# Patient Record
Sex: Male | Born: 1940 | Race: White | Hispanic: No | Marital: Married | Smoking: Former smoker
Health system: Southern US, Community
[De-identification: ages and names within clinical notes are randomized; demographics above are authoritative.]

## PROBLEM LIST (undated history)

## (undated) DIAGNOSIS — I482 Chronic atrial fibrillation, unspecified: Secondary | ICD-10-CM

## (undated) DIAGNOSIS — C61 Malignant neoplasm of prostate: Secondary | ICD-10-CM

## (undated) DIAGNOSIS — I2510 Atherosclerotic heart disease of native coronary artery without angina pectoris: Secondary | ICD-10-CM

## (undated) DIAGNOSIS — I350 Nonrheumatic aortic (valve) stenosis: Secondary | ICD-10-CM

## (undated) DIAGNOSIS — M1990 Unspecified osteoarthritis, unspecified site: Secondary | ICD-10-CM

## (undated) DIAGNOSIS — E785 Hyperlipidemia, unspecified: Secondary | ICD-10-CM

## (undated) DIAGNOSIS — Z952 Presence of prosthetic heart valve: Secondary | ICD-10-CM

## (undated) HISTORY — DX: Hyperlipidemia, unspecified: E78.5

## (undated) HISTORY — DX: Unspecified osteoarthritis, unspecified site: M19.90

## (undated) HISTORY — PX: JOINT REPLACEMENT: SHX530

## (undated) HISTORY — PX: APPENDECTOMY: SHX54

## (undated) HISTORY — DX: Malignant neoplasm of prostate: C61

## (undated) HISTORY — PX: INSERTION PROSTATE RADIATION SEED: SUR718

## (undated) HISTORY — DX: Atherosclerotic heart disease of native coronary artery without angina pectoris: I25.10

## (undated) HISTORY — PX: REPLACEMENT TOTAL KNEE BILATERAL: SUR1225

## (undated) HISTORY — PX: CORONARY ANGIOPLASTY WITH STENT PLACEMENT: SHX49

---

## 2003-11-28 ENCOUNTER — Ambulatory Visit: Payer: Self-pay | Admitting: Family Medicine

## 2003-12-13 ENCOUNTER — Encounter: Admission: RE | Admit: 2003-12-13 | Discharge: 2003-12-13 | Payer: Self-pay | Admitting: Family Medicine

## 2004-08-20 ENCOUNTER — Ambulatory Visit: Payer: Self-pay | Admitting: Family Medicine

## 2005-01-10 ENCOUNTER — Ambulatory Visit: Payer: Self-pay | Admitting: Family Medicine

## 2006-09-04 ENCOUNTER — Ambulatory Visit: Admission: RE | Admit: 2006-09-04 | Discharge: 2006-12-03 | Payer: Self-pay | Admitting: Family Medicine

## 2006-11-27 ENCOUNTER — Encounter: Admission: RE | Admit: 2006-11-27 | Discharge: 2006-11-27 | Payer: Self-pay | Admitting: Urology

## 2006-12-07 ENCOUNTER — Ambulatory Visit (HOSPITAL_BASED_OUTPATIENT_CLINIC_OR_DEPARTMENT_OTHER): Admission: RE | Admit: 2006-12-07 | Discharge: 2006-12-07 | Payer: Self-pay | Admitting: Urology

## 2007-01-01 ENCOUNTER — Ambulatory Visit: Admission: RE | Admit: 2007-01-01 | Discharge: 2007-02-09 | Payer: Self-pay | Admitting: Radiation Oncology

## 2010-06-25 NOTE — Op Note (Signed)
NAME:  Eric Perry, Eric Perry              ACCOUNT NO.:  [id number]   MEDICAL RECORD NO.:  [id number]          PATIENT TYPE:  AMB   LOCATION:  [facility]                         FACILITY:  [facility]   PHYSICIAN:  Mark C. Vernie Ammons, M.D.  DATE OF BIRTH:  Jun 15, 1940   DATE OF PROCEDURE:  12/07/2006  DATE OF DISCHARGE:                               OPERATIVE REPORT   PREOPERATIVE DIAGNOSIS:  Adenocarcinoma of the prostate.   POSTOPERATIVE DIAGNOSIS:  1. Adenocarcinoma of the prostate.  2. Bladder diverticulum.   SURGEON:  Mark C. Vernie Ammons, M.D.   RADIATION ONCOLOGIST:  Artist Pais. Kathrynn Running, M.D.   ANESTHESIA:  General.   DRAIN:  16-French Foley catheter.   BLOOD LOSS:  Minimal.   SPECIMENS:  None.   NUMBER OF NEEDLES:  19.   NUMBER OF SEEDS:  69.   COMPLICATIONS:  None.   INDICATIONS:  The patient is a 70 year old white male with biopsy-proven  adenocarcinoma of the prostate Gleason 4+3.  He elected to undergo  treatment of his prostate cancer with radiation and has undergone  external beam boost.  He presents today for completion of his radiation  treatment with radioactive seed implant.  The risks, complications and  alternatives were discussed.  He understands and elected to proceed.   DESCRIPTION OF OPERATION:  After informed consent, the patient brought  to major OR, placed on table administered general anesthesia then moved  to the dorsal lithotomy position.  An official time-out was then  performed.  His genitalia and perineum were sterilely prepped and  draped.  A catheter was placed in the bladder with dilute contrast.  The  rectal tube and an transrectal ultrasound probe was placed and affixed  to the stand.  Dr. Kathrynn Running then performed real time seed planning.   I then proceeded to use the Nucletron and the real time plan to insert  the 19 needles under direct ultrasound control and visualization.  This  proceeded without complication.  The transrectal ultrasound probe and  rectal  tube were removed and as well as the Foley catheter.  The penis  was reprepped with Betadine and flexible cystoscopy was then performed.   Flexible cystoscopy was performed using a 17-French flexible scope.  Urethra is noted to be normal down to the sphincter which appeared  intact.  Prostatic urethra revealed bilobar hypertrophy and semi-  elongation.  On the floor the prostate, I saw a single seed protruding  from the prostatic mucosa.  I used the scope to gently tease the seed  out and this passed on into the bladder.  I then entered the bladder and  fully inspected this.  It was noted to be free of any tumor, stones or  inflammatory lesions.  On the floor of the bladder on the right-hand  side was a large wide-mouth diverticulum.  I scoped this and found no  tumor, stones and no seed.  There was some trabeculation of the bladder  making it difficult to identify the seed that had to passed into the  bladder.  Since the risk was of the single seen in the bladder was  low,  I felt further search was not necessary.  Therefore I replaced the 16-  French Foley catheter.  This was connected to closed system drainage.  The patient tolerated procedure well with no intraoperative  complications.   He will be given a prescription for 24 Vicodin 10 and Cipro 500 mg  tablets and seven Flomax 0.4 mg and will return to my office in three  weeks for follow-up.      Mark C. Vernie Ammons, M.D.  Electronically Signed     MCO/MEDQ  D:  12/07/2006  T:  12/07/2006  Job:  [id number]   cc:   Artist Pais Kathrynn Running, M.D.  Fax: [phone]

## 2010-11-20 LAB — CBC
HCT: 43.8
Hemoglobin: 15.2
MCHC: 34.6
MCV: 87.4
Platelets: 196
RBC: 5.02
RDW: 13.6
WBC: 6.3

## 2010-11-20 LAB — COMPREHENSIVE METABOLIC PANEL
ALT: 17
AST: 20
Albumin: 3.5
Alkaline Phosphatase: 62
BUN: 7
CO2: 32
Calcium: 9.2
Chloride: 96
Creatinine, Ser: 0.64
GFR calc Af Amer: >60
GFR calc non Af Amer: >60
Glucose, Bld: 130 — ABNORMAL HIGH
Potassium: 3.8
Sodium: 135
Total Bilirubin: 1.1
Total Protein: 6.1

## 2010-11-20 LAB — APTT: aPTT: 30

## 2010-11-20 LAB — PROTIME-INR
INR: 1
Prothrombin Time: 13.2

## 2011-03-14 DIAGNOSIS — M5030 Other cervical disc degeneration, unspecified cervical region: Secondary | ICD-10-CM | POA: Diagnosis not present

## 2011-03-14 DIAGNOSIS — M999 Biomechanical lesion, unspecified: Secondary | ICD-10-CM | POA: Diagnosis not present

## 2011-03-14 DIAGNOSIS — M9981 Other biomechanical lesions of cervical region: Secondary | ICD-10-CM | POA: Diagnosis not present

## 2011-03-14 DIAGNOSIS — M5412 Radiculopathy, cervical region: Secondary | ICD-10-CM | POA: Diagnosis not present

## 2011-04-11 DIAGNOSIS — M5412 Radiculopathy, cervical region: Secondary | ICD-10-CM | POA: Diagnosis not present

## 2011-04-11 DIAGNOSIS — M9981 Other biomechanical lesions of cervical region: Secondary | ICD-10-CM | POA: Diagnosis not present

## 2011-04-11 DIAGNOSIS — M999 Biomechanical lesion, unspecified: Secondary | ICD-10-CM | POA: Diagnosis not present

## 2011-04-11 DIAGNOSIS — M5030 Other cervical disc degeneration, unspecified cervical region: Secondary | ICD-10-CM | POA: Diagnosis not present

## 2011-06-12 DIAGNOSIS — M9981 Other biomechanical lesions of cervical region: Secondary | ICD-10-CM | POA: Diagnosis not present

## 2011-06-12 DIAGNOSIS — M5030 Other cervical disc degeneration, unspecified cervical region: Secondary | ICD-10-CM | POA: Diagnosis not present

## 2011-06-12 DIAGNOSIS — M999 Biomechanical lesion, unspecified: Secondary | ICD-10-CM | POA: Diagnosis not present

## 2011-06-12 DIAGNOSIS — M5412 Radiculopathy, cervical region: Secondary | ICD-10-CM | POA: Diagnosis not present

## 2011-06-13 DIAGNOSIS — M1710 Unilateral primary osteoarthritis, unspecified knee: Secondary | ICD-10-CM | POA: Diagnosis not present

## 2011-06-13 DIAGNOSIS — M25569 Pain in unspecified knee: Secondary | ICD-10-CM | POA: Diagnosis not present

## 2011-06-13 DIAGNOSIS — Z6835 Body mass index (BMI) 35.0-35.9, adult: Secondary | ICD-10-CM | POA: Diagnosis not present

## 2011-06-19 DIAGNOSIS — M5412 Radiculopathy, cervical region: Secondary | ICD-10-CM | POA: Diagnosis not present

## 2011-06-19 DIAGNOSIS — M999 Biomechanical lesion, unspecified: Secondary | ICD-10-CM | POA: Diagnosis not present

## 2011-06-19 DIAGNOSIS — M5030 Other cervical disc degeneration, unspecified cervical region: Secondary | ICD-10-CM | POA: Diagnosis not present

## 2011-06-19 DIAGNOSIS — M9981 Other biomechanical lesions of cervical region: Secondary | ICD-10-CM | POA: Diagnosis not present

## 2011-07-21 DIAGNOSIS — M1710 Unilateral primary osteoarthritis, unspecified knee: Secondary | ICD-10-CM | POA: Diagnosis not present

## 2011-07-23 DIAGNOSIS — Z8546 Personal history of malignant neoplasm of prostate: Secondary | ICD-10-CM | POA: Diagnosis not present

## 2011-07-29 DIAGNOSIS — R339 Retention of urine, unspecified: Secondary | ICD-10-CM | POA: Diagnosis not present

## 2011-07-29 DIAGNOSIS — N401 Enlarged prostate with lower urinary tract symptoms: Secondary | ICD-10-CM | POA: Diagnosis not present

## 2011-07-29 DIAGNOSIS — Z8546 Personal history of malignant neoplasm of prostate: Secondary | ICD-10-CM | POA: Diagnosis not present

## 2011-07-29 DIAGNOSIS — N3000 Acute cystitis without hematuria: Secondary | ICD-10-CM | POA: Diagnosis not present

## 2011-07-31 DIAGNOSIS — M9981 Other biomechanical lesions of cervical region: Secondary | ICD-10-CM | POA: Diagnosis not present

## 2011-07-31 DIAGNOSIS — M5030 Other cervical disc degeneration, unspecified cervical region: Secondary | ICD-10-CM | POA: Diagnosis not present

## 2011-07-31 DIAGNOSIS — M5412 Radiculopathy, cervical region: Secondary | ICD-10-CM | POA: Diagnosis not present

## 2011-07-31 DIAGNOSIS — M999 Biomechanical lesion, unspecified: Secondary | ICD-10-CM | POA: Diagnosis not present

## 2011-08-18 DIAGNOSIS — M1710 Unilateral primary osteoarthritis, unspecified knee: Secondary | ICD-10-CM | POA: Diagnosis not present

## 2011-08-22 DIAGNOSIS — M999 Biomechanical lesion, unspecified: Secondary | ICD-10-CM | POA: Diagnosis not present

## 2011-08-22 DIAGNOSIS — M9981 Other biomechanical lesions of cervical region: Secondary | ICD-10-CM | POA: Diagnosis not present

## 2011-08-22 DIAGNOSIS — M5412 Radiculopathy, cervical region: Secondary | ICD-10-CM | POA: Diagnosis not present

## 2011-08-22 DIAGNOSIS — M5030 Other cervical disc degeneration, unspecified cervical region: Secondary | ICD-10-CM | POA: Diagnosis not present

## 2011-08-25 DIAGNOSIS — M1710 Unilateral primary osteoarthritis, unspecified knee: Secondary | ICD-10-CM | POA: Diagnosis not present

## 2011-08-26 DIAGNOSIS — IMO0002 Reserved for concepts with insufficient information to code with codable children: Secondary | ICD-10-CM | POA: Diagnosis not present

## 2011-08-26 DIAGNOSIS — M549 Dorsalgia, unspecified: Secondary | ICD-10-CM | POA: Diagnosis not present

## 2011-09-01 DIAGNOSIS — M1710 Unilateral primary osteoarthritis, unspecified knee: Secondary | ICD-10-CM | POA: Diagnosis not present

## 2011-10-09 DIAGNOSIS — M5412 Radiculopathy, cervical region: Secondary | ICD-10-CM | POA: Diagnosis not present

## 2011-10-09 DIAGNOSIS — M5030 Other cervical disc degeneration, unspecified cervical region: Secondary | ICD-10-CM | POA: Diagnosis not present

## 2011-10-09 DIAGNOSIS — M9981 Other biomechanical lesions of cervical region: Secondary | ICD-10-CM | POA: Diagnosis not present

## 2011-10-09 DIAGNOSIS — M999 Biomechanical lesion, unspecified: Secondary | ICD-10-CM | POA: Diagnosis not present

## 2011-10-15 DIAGNOSIS — M25569 Pain in unspecified knee: Secondary | ICD-10-CM | POA: Diagnosis not present

## 2011-10-15 DIAGNOSIS — M1710 Unilateral primary osteoarthritis, unspecified knee: Secondary | ICD-10-CM | POA: Diagnosis not present

## 2011-11-27 DIAGNOSIS — M9981 Other biomechanical lesions of cervical region: Secondary | ICD-10-CM | POA: Diagnosis not present

## 2011-11-27 DIAGNOSIS — M5030 Other cervical disc degeneration, unspecified cervical region: Secondary | ICD-10-CM | POA: Diagnosis not present

## 2011-11-27 DIAGNOSIS — M5412 Radiculopathy, cervical region: Secondary | ICD-10-CM | POA: Diagnosis not present

## 2011-11-27 DIAGNOSIS — M999 Biomechanical lesion, unspecified: Secondary | ICD-10-CM | POA: Diagnosis not present

## 2012-01-01 DIAGNOSIS — C44621 Squamous cell carcinoma of skin of unspecified upper limb, including shoulder: Secondary | ICD-10-CM | POA: Diagnosis not present

## 2012-01-01 DIAGNOSIS — D485 Neoplasm of uncertain behavior of skin: Secondary | ICD-10-CM | POA: Diagnosis not present

## 2012-01-14 DIAGNOSIS — C44691 Other specified malignant neoplasm of skin of unspecified upper limb, including shoulder: Secondary | ICD-10-CM | POA: Diagnosis not present

## 2012-01-14 DIAGNOSIS — L988 Other specified disorders of the skin and subcutaneous tissue: Secondary | ICD-10-CM | POA: Diagnosis not present

## 2012-01-14 DIAGNOSIS — C44621 Squamous cell carcinoma of skin of unspecified upper limb, including shoulder: Secondary | ICD-10-CM | POA: Diagnosis not present

## 2012-01-14 DIAGNOSIS — L089 Local infection of the skin and subcutaneous tissue, unspecified: Secondary | ICD-10-CM | POA: Diagnosis not present

## 2012-01-30 DIAGNOSIS — M9981 Other biomechanical lesions of cervical region: Secondary | ICD-10-CM | POA: Diagnosis not present

## 2012-01-30 DIAGNOSIS — M5412 Radiculopathy, cervical region: Secondary | ICD-10-CM | POA: Diagnosis not present

## 2012-01-30 DIAGNOSIS — M5030 Other cervical disc degeneration, unspecified cervical region: Secondary | ICD-10-CM | POA: Diagnosis not present

## 2012-01-30 DIAGNOSIS — M999 Biomechanical lesion, unspecified: Secondary | ICD-10-CM | POA: Diagnosis not present

## 2012-03-12 DIAGNOSIS — M9981 Other biomechanical lesions of cervical region: Secondary | ICD-10-CM | POA: Diagnosis not present

## 2012-03-12 DIAGNOSIS — M5412 Radiculopathy, cervical region: Secondary | ICD-10-CM | POA: Diagnosis not present

## 2012-03-12 DIAGNOSIS — M999 Biomechanical lesion, unspecified: Secondary | ICD-10-CM | POA: Diagnosis not present

## 2012-03-12 DIAGNOSIS — M5030 Other cervical disc degeneration, unspecified cervical region: Secondary | ICD-10-CM | POA: Diagnosis not present

## 2012-04-23 DIAGNOSIS — M5412 Radiculopathy, cervical region: Secondary | ICD-10-CM | POA: Diagnosis not present

## 2012-04-23 DIAGNOSIS — M9981 Other biomechanical lesions of cervical region: Secondary | ICD-10-CM | POA: Diagnosis not present

## 2012-04-23 DIAGNOSIS — M999 Biomechanical lesion, unspecified: Secondary | ICD-10-CM | POA: Diagnosis not present

## 2012-04-23 DIAGNOSIS — M5030 Other cervical disc degeneration, unspecified cervical region: Secondary | ICD-10-CM | POA: Diagnosis not present

## 2012-06-03 DIAGNOSIS — M9981 Other biomechanical lesions of cervical region: Secondary | ICD-10-CM | POA: Diagnosis not present

## 2012-06-03 DIAGNOSIS — M5030 Other cervical disc degeneration, unspecified cervical region: Secondary | ICD-10-CM | POA: Diagnosis not present

## 2012-06-03 DIAGNOSIS — M5412 Radiculopathy, cervical region: Secondary | ICD-10-CM | POA: Diagnosis not present

## 2012-06-03 DIAGNOSIS — M999 Biomechanical lesion, unspecified: Secondary | ICD-10-CM | POA: Diagnosis not present

## 2012-07-01 DIAGNOSIS — M5030 Other cervical disc degeneration, unspecified cervical region: Secondary | ICD-10-CM | POA: Diagnosis not present

## 2012-07-01 DIAGNOSIS — M5412 Radiculopathy, cervical region: Secondary | ICD-10-CM | POA: Diagnosis not present

## 2012-07-01 DIAGNOSIS — M999 Biomechanical lesion, unspecified: Secondary | ICD-10-CM | POA: Diagnosis not present

## 2012-07-01 DIAGNOSIS — M9981 Other biomechanical lesions of cervical region: Secondary | ICD-10-CM | POA: Diagnosis not present

## 2012-08-02 DIAGNOSIS — N401 Enlarged prostate with lower urinary tract symptoms: Secondary | ICD-10-CM | POA: Diagnosis not present

## 2012-08-02 DIAGNOSIS — Z8546 Personal history of malignant neoplasm of prostate: Secondary | ICD-10-CM | POA: Diagnosis not present

## 2012-08-02 DIAGNOSIS — R351 Nocturia: Secondary | ICD-10-CM | POA: Diagnosis not present

## 2012-08-02 DIAGNOSIS — R339 Retention of urine, unspecified: Secondary | ICD-10-CM | POA: Diagnosis not present

## 2012-10-08 DIAGNOSIS — M999 Biomechanical lesion, unspecified: Secondary | ICD-10-CM | POA: Diagnosis not present

## 2012-10-08 DIAGNOSIS — M5412 Radiculopathy, cervical region: Secondary | ICD-10-CM | POA: Diagnosis not present

## 2012-10-08 DIAGNOSIS — M9981 Other biomechanical lesions of cervical region: Secondary | ICD-10-CM | POA: Diagnosis not present

## 2012-10-08 DIAGNOSIS — M5030 Other cervical disc degeneration, unspecified cervical region: Secondary | ICD-10-CM | POA: Diagnosis not present

## 2012-11-24 DIAGNOSIS — Z23 Encounter for immunization: Secondary | ICD-10-CM | POA: Diagnosis not present

## 2012-12-24 DIAGNOSIS — M5030 Other cervical disc degeneration, unspecified cervical region: Secondary | ICD-10-CM | POA: Diagnosis not present

## 2012-12-24 DIAGNOSIS — M5412 Radiculopathy, cervical region: Secondary | ICD-10-CM | POA: Diagnosis not present

## 2012-12-24 DIAGNOSIS — M999 Biomechanical lesion, unspecified: Secondary | ICD-10-CM | POA: Diagnosis not present

## 2012-12-24 DIAGNOSIS — M9981 Other biomechanical lesions of cervical region: Secondary | ICD-10-CM | POA: Diagnosis not present

## 2013-01-28 DIAGNOSIS — M5030 Other cervical disc degeneration, unspecified cervical region: Secondary | ICD-10-CM | POA: Diagnosis not present

## 2013-01-28 DIAGNOSIS — M5412 Radiculopathy, cervical region: Secondary | ICD-10-CM | POA: Diagnosis not present

## 2013-01-28 DIAGNOSIS — M999 Biomechanical lesion, unspecified: Secondary | ICD-10-CM | POA: Diagnosis not present

## 2013-01-28 DIAGNOSIS — M9981 Other biomechanical lesions of cervical region: Secondary | ICD-10-CM | POA: Diagnosis not present

## 2013-02-18 DIAGNOSIS — M1710 Unilateral primary osteoarthritis, unspecified knee: Secondary | ICD-10-CM | POA: Diagnosis not present

## 2013-02-18 DIAGNOSIS — M25569 Pain in unspecified knee: Secondary | ICD-10-CM | POA: Diagnosis not present

## 2013-03-24 DIAGNOSIS — Z01810 Encounter for preprocedural cardiovascular examination: Secondary | ICD-10-CM | POA: Diagnosis not present

## 2013-03-24 DIAGNOSIS — K044 Acute apical periodontitis of pulpal origin: Secondary | ICD-10-CM | POA: Diagnosis not present

## 2013-04-12 DIAGNOSIS — M9981 Other biomechanical lesions of cervical region: Secondary | ICD-10-CM | POA: Diagnosis not present

## 2013-04-12 DIAGNOSIS — M5412 Radiculopathy, cervical region: Secondary | ICD-10-CM | POA: Diagnosis not present

## 2013-04-12 DIAGNOSIS — M1710 Unilateral primary osteoarthritis, unspecified knee: Secondary | ICD-10-CM | POA: Diagnosis not present

## 2013-04-12 DIAGNOSIS — M999 Biomechanical lesion, unspecified: Secondary | ICD-10-CM | POA: Diagnosis not present

## 2013-04-12 DIAGNOSIS — M5030 Other cervical disc degeneration, unspecified cervical region: Secondary | ICD-10-CM | POA: Diagnosis not present

## 2013-05-17 DIAGNOSIS — M1990 Unspecified osteoarthritis, unspecified site: Secondary | ICD-10-CM | POA: Diagnosis not present

## 2013-05-17 DIAGNOSIS — M25569 Pain in unspecified knee: Secondary | ICD-10-CM | POA: Diagnosis not present

## 2013-05-25 DIAGNOSIS — M1990 Unspecified osteoarthritis, unspecified site: Secondary | ICD-10-CM | POA: Diagnosis not present

## 2013-05-31 DIAGNOSIS — I10 Essential (primary) hypertension: Secondary | ICD-10-CM | POA: Diagnosis not present

## 2013-05-31 DIAGNOSIS — I2510 Atherosclerotic heart disease of native coronary artery without angina pectoris: Secondary | ICD-10-CM | POA: Diagnosis not present

## 2013-06-01 DIAGNOSIS — Z01818 Encounter for other preprocedural examination: Secondary | ICD-10-CM | POA: Diagnosis not present

## 2013-06-01 DIAGNOSIS — R52 Pain, unspecified: Secondary | ICD-10-CM | POA: Diagnosis not present

## 2013-06-01 DIAGNOSIS — Z79899 Other long term (current) drug therapy: Secondary | ICD-10-CM | POA: Diagnosis not present

## 2013-06-01 DIAGNOSIS — Z96659 Presence of unspecified artificial knee joint: Secondary | ICD-10-CM | POA: Diagnosis not present

## 2013-06-01 DIAGNOSIS — M79609 Pain in unspecified limb: Secondary | ICD-10-CM | POA: Diagnosis not present

## 2013-06-01 DIAGNOSIS — Z0389 Encounter for observation for other suspected diseases and conditions ruled out: Secondary | ICD-10-CM | POA: Diagnosis not present

## 2013-06-09 DIAGNOSIS — E7800 Pure hypercholesterolemia, unspecified: Secondary | ICD-10-CM | POA: Diagnosis present

## 2013-06-09 DIAGNOSIS — Z96659 Presence of unspecified artificial knee joint: Secondary | ICD-10-CM | POA: Diagnosis not present

## 2013-06-09 DIAGNOSIS — Z79899 Other long term (current) drug therapy: Secondary | ICD-10-CM | POA: Diagnosis not present

## 2013-06-09 DIAGNOSIS — I2510 Atherosclerotic heart disease of native coronary artery without angina pectoris: Secondary | ICD-10-CM | POA: Diagnosis not present

## 2013-06-09 DIAGNOSIS — N400 Enlarged prostate without lower urinary tract symptoms: Secondary | ICD-10-CM | POA: Diagnosis present

## 2013-06-09 DIAGNOSIS — K219 Gastro-esophageal reflux disease without esophagitis: Secondary | ICD-10-CM | POA: Diagnosis present

## 2013-06-09 DIAGNOSIS — I519 Heart disease, unspecified: Secondary | ICD-10-CM | POA: Diagnosis present

## 2013-06-09 DIAGNOSIS — G8929 Other chronic pain: Secondary | ICD-10-CM | POA: Diagnosis present

## 2013-06-09 DIAGNOSIS — IMO0002 Reserved for concepts with insufficient information to code with codable children: Secondary | ICD-10-CM | POA: Diagnosis not present

## 2013-06-09 DIAGNOSIS — Z471 Aftercare following joint replacement surgery: Secondary | ICD-10-CM | POA: Diagnosis not present

## 2013-06-09 DIAGNOSIS — Z9861 Coronary angioplasty status: Secondary | ICD-10-CM | POA: Diagnosis not present

## 2013-06-09 DIAGNOSIS — Z7982 Long term (current) use of aspirin: Secondary | ICD-10-CM | POA: Diagnosis not present

## 2013-06-09 DIAGNOSIS — I10 Essential (primary) hypertension: Secondary | ICD-10-CM | POA: Diagnosis not present

## 2013-06-09 DIAGNOSIS — M1710 Unilateral primary osteoarthritis, unspecified knee: Secondary | ICD-10-CM | POA: Diagnosis present

## 2013-06-12 DIAGNOSIS — R262 Difficulty in walking, not elsewhere classified: Secondary | ICD-10-CM | POA: Diagnosis not present

## 2013-06-12 DIAGNOSIS — IMO0001 Reserved for inherently not codable concepts without codable children: Secondary | ICD-10-CM | POA: Diagnosis not present

## 2013-06-12 DIAGNOSIS — Z96659 Presence of unspecified artificial knee joint: Secondary | ICD-10-CM | POA: Diagnosis not present

## 2013-06-12 DIAGNOSIS — M6281 Muscle weakness (generalized): Secondary | ICD-10-CM | POA: Diagnosis not present

## 2013-06-12 DIAGNOSIS — M25569 Pain in unspecified knee: Secondary | ICD-10-CM | POA: Diagnosis not present

## 2013-06-12 DIAGNOSIS — Z471 Aftercare following joint replacement surgery: Secondary | ICD-10-CM | POA: Diagnosis not present

## 2013-06-13 DIAGNOSIS — Z96659 Presence of unspecified artificial knee joint: Secondary | ICD-10-CM | POA: Diagnosis not present

## 2013-06-13 DIAGNOSIS — IMO0001 Reserved for inherently not codable concepts without codable children: Secondary | ICD-10-CM | POA: Diagnosis not present

## 2013-06-13 DIAGNOSIS — M25569 Pain in unspecified knee: Secondary | ICD-10-CM | POA: Diagnosis not present

## 2013-06-13 DIAGNOSIS — M6281 Muscle weakness (generalized): Secondary | ICD-10-CM | POA: Diagnosis not present

## 2013-06-13 DIAGNOSIS — Z471 Aftercare following joint replacement surgery: Secondary | ICD-10-CM | POA: Diagnosis not present

## 2013-06-13 DIAGNOSIS — R262 Difficulty in walking, not elsewhere classified: Secondary | ICD-10-CM | POA: Diagnosis not present

## 2013-06-15 DIAGNOSIS — M6281 Muscle weakness (generalized): Secondary | ICD-10-CM | POA: Diagnosis not present

## 2013-06-15 DIAGNOSIS — Z471 Aftercare following joint replacement surgery: Secondary | ICD-10-CM | POA: Diagnosis not present

## 2013-06-15 DIAGNOSIS — IMO0001 Reserved for inherently not codable concepts without codable children: Secondary | ICD-10-CM | POA: Diagnosis not present

## 2013-06-15 DIAGNOSIS — Z96659 Presence of unspecified artificial knee joint: Secondary | ICD-10-CM | POA: Diagnosis not present

## 2013-06-15 DIAGNOSIS — R262 Difficulty in walking, not elsewhere classified: Secondary | ICD-10-CM | POA: Diagnosis not present

## 2013-06-15 DIAGNOSIS — M25569 Pain in unspecified knee: Secondary | ICD-10-CM | POA: Diagnosis not present

## 2013-06-17 DIAGNOSIS — R262 Difficulty in walking, not elsewhere classified: Secondary | ICD-10-CM | POA: Diagnosis not present

## 2013-06-17 DIAGNOSIS — M6281 Muscle weakness (generalized): Secondary | ICD-10-CM | POA: Diagnosis not present

## 2013-06-17 DIAGNOSIS — Z96659 Presence of unspecified artificial knee joint: Secondary | ICD-10-CM | POA: Diagnosis not present

## 2013-06-17 DIAGNOSIS — IMO0001 Reserved for inherently not codable concepts without codable children: Secondary | ICD-10-CM | POA: Diagnosis not present

## 2013-06-17 DIAGNOSIS — Z471 Aftercare following joint replacement surgery: Secondary | ICD-10-CM | POA: Diagnosis not present

## 2013-06-17 DIAGNOSIS — M25569 Pain in unspecified knee: Secondary | ICD-10-CM | POA: Diagnosis not present

## 2013-06-20 DIAGNOSIS — M25569 Pain in unspecified knee: Secondary | ICD-10-CM | POA: Diagnosis not present

## 2013-06-20 DIAGNOSIS — IMO0001 Reserved for inherently not codable concepts without codable children: Secondary | ICD-10-CM | POA: Diagnosis not present

## 2013-06-20 DIAGNOSIS — M6281 Muscle weakness (generalized): Secondary | ICD-10-CM | POA: Diagnosis not present

## 2013-06-20 DIAGNOSIS — R262 Difficulty in walking, not elsewhere classified: Secondary | ICD-10-CM | POA: Diagnosis not present

## 2013-06-20 DIAGNOSIS — Z471 Aftercare following joint replacement surgery: Secondary | ICD-10-CM | POA: Diagnosis not present

## 2013-06-20 DIAGNOSIS — Z96659 Presence of unspecified artificial knee joint: Secondary | ICD-10-CM | POA: Diagnosis not present

## 2013-06-22 DIAGNOSIS — M1710 Unilateral primary osteoarthritis, unspecified knee: Secondary | ICD-10-CM | POA: Diagnosis not present

## 2013-06-22 DIAGNOSIS — IMO0002 Reserved for concepts with insufficient information to code with codable children: Secondary | ICD-10-CM | POA: Diagnosis not present

## 2013-06-24 DIAGNOSIS — M1710 Unilateral primary osteoarthritis, unspecified knee: Secondary | ICD-10-CM | POA: Diagnosis not present

## 2013-06-24 DIAGNOSIS — IMO0002 Reserved for concepts with insufficient information to code with codable children: Secondary | ICD-10-CM | POA: Diagnosis not present

## 2013-06-27 DIAGNOSIS — M1710 Unilateral primary osteoarthritis, unspecified knee: Secondary | ICD-10-CM | POA: Diagnosis not present

## 2013-06-27 DIAGNOSIS — IMO0002 Reserved for concepts with insufficient information to code with codable children: Secondary | ICD-10-CM | POA: Diagnosis not present

## 2013-06-29 DIAGNOSIS — M1710 Unilateral primary osteoarthritis, unspecified knee: Secondary | ICD-10-CM | POA: Diagnosis not present

## 2013-06-29 DIAGNOSIS — IMO0002 Reserved for concepts with insufficient information to code with codable children: Secondary | ICD-10-CM | POA: Diagnosis not present

## 2013-07-01 DIAGNOSIS — M1710 Unilateral primary osteoarthritis, unspecified knee: Secondary | ICD-10-CM | POA: Diagnosis not present

## 2013-07-01 DIAGNOSIS — IMO0002 Reserved for concepts with insufficient information to code with codable children: Secondary | ICD-10-CM | POA: Diagnosis not present

## 2013-07-05 DIAGNOSIS — IMO0002 Reserved for concepts with insufficient information to code with codable children: Secondary | ICD-10-CM | POA: Diagnosis not present

## 2013-07-05 DIAGNOSIS — M1710 Unilateral primary osteoarthritis, unspecified knee: Secondary | ICD-10-CM | POA: Diagnosis not present

## 2013-07-07 DIAGNOSIS — M1710 Unilateral primary osteoarthritis, unspecified knee: Secondary | ICD-10-CM | POA: Diagnosis not present

## 2013-07-07 DIAGNOSIS — IMO0002 Reserved for concepts with insufficient information to code with codable children: Secondary | ICD-10-CM | POA: Diagnosis not present

## 2013-07-12 DIAGNOSIS — M1710 Unilateral primary osteoarthritis, unspecified knee: Secondary | ICD-10-CM | POA: Diagnosis not present

## 2013-07-12 DIAGNOSIS — M5030 Other cervical disc degeneration, unspecified cervical region: Secondary | ICD-10-CM | POA: Diagnosis not present

## 2013-07-12 DIAGNOSIS — M5412 Radiculopathy, cervical region: Secondary | ICD-10-CM | POA: Diagnosis not present

## 2013-07-12 DIAGNOSIS — M999 Biomechanical lesion, unspecified: Secondary | ICD-10-CM | POA: Diagnosis not present

## 2013-07-12 DIAGNOSIS — M9981 Other biomechanical lesions of cervical region: Secondary | ICD-10-CM | POA: Diagnosis not present

## 2013-07-12 DIAGNOSIS — IMO0002 Reserved for concepts with insufficient information to code with codable children: Secondary | ICD-10-CM | POA: Diagnosis not present

## 2013-07-14 DIAGNOSIS — M5412 Radiculopathy, cervical region: Secondary | ICD-10-CM | POA: Diagnosis not present

## 2013-07-14 DIAGNOSIS — M1710 Unilateral primary osteoarthritis, unspecified knee: Secondary | ICD-10-CM | POA: Diagnosis not present

## 2013-07-14 DIAGNOSIS — M5030 Other cervical disc degeneration, unspecified cervical region: Secondary | ICD-10-CM | POA: Diagnosis not present

## 2013-07-14 DIAGNOSIS — M999 Biomechanical lesion, unspecified: Secondary | ICD-10-CM | POA: Diagnosis not present

## 2013-07-14 DIAGNOSIS — M9981 Other biomechanical lesions of cervical region: Secondary | ICD-10-CM | POA: Diagnosis not present

## 2013-07-14 DIAGNOSIS — IMO0002 Reserved for concepts with insufficient information to code with codable children: Secondary | ICD-10-CM | POA: Diagnosis not present

## 2013-07-20 DIAGNOSIS — M1990 Unspecified osteoarthritis, unspecified site: Secondary | ICD-10-CM | POA: Diagnosis not present

## 2013-07-20 DIAGNOSIS — Z96659 Presence of unspecified artificial knee joint: Secondary | ICD-10-CM | POA: Diagnosis not present

## 2013-08-05 DIAGNOSIS — M999 Biomechanical lesion, unspecified: Secondary | ICD-10-CM | POA: Diagnosis not present

## 2013-08-05 DIAGNOSIS — M5030 Other cervical disc degeneration, unspecified cervical region: Secondary | ICD-10-CM | POA: Diagnosis not present

## 2013-08-05 DIAGNOSIS — M5412 Radiculopathy, cervical region: Secondary | ICD-10-CM | POA: Diagnosis not present

## 2013-08-05 DIAGNOSIS — M9981 Other biomechanical lesions of cervical region: Secondary | ICD-10-CM | POA: Diagnosis not present

## 2013-08-09 DIAGNOSIS — N401 Enlarged prostate with lower urinary tract symptoms: Secondary | ICD-10-CM | POA: Diagnosis not present

## 2013-08-09 DIAGNOSIS — N138 Other obstructive and reflux uropathy: Secondary | ICD-10-CM | POA: Diagnosis not present

## 2013-08-09 DIAGNOSIS — N3944 Nocturnal enuresis: Secondary | ICD-10-CM | POA: Diagnosis not present

## 2013-08-09 DIAGNOSIS — Z8546 Personal history of malignant neoplasm of prostate: Secondary | ICD-10-CM | POA: Diagnosis not present

## 2013-10-06 DIAGNOSIS — M9981 Other biomechanical lesions of cervical region: Secondary | ICD-10-CM | POA: Diagnosis not present

## 2013-10-06 DIAGNOSIS — M5030 Other cervical disc degeneration, unspecified cervical region: Secondary | ICD-10-CM | POA: Diagnosis not present

## 2013-10-06 DIAGNOSIS — M5412 Radiculopathy, cervical region: Secondary | ICD-10-CM | POA: Diagnosis not present

## 2013-10-06 DIAGNOSIS — M999 Biomechanical lesion, unspecified: Secondary | ICD-10-CM | POA: Diagnosis not present

## 2013-11-03 DIAGNOSIS — M999 Biomechanical lesion, unspecified: Secondary | ICD-10-CM | POA: Diagnosis not present

## 2013-11-03 DIAGNOSIS — M9981 Other biomechanical lesions of cervical region: Secondary | ICD-10-CM | POA: Diagnosis not present

## 2013-11-03 DIAGNOSIS — Z23 Encounter for immunization: Secondary | ICD-10-CM | POA: Diagnosis not present

## 2013-11-03 DIAGNOSIS — M5412 Radiculopathy, cervical region: Secondary | ICD-10-CM | POA: Diagnosis not present

## 2013-11-03 DIAGNOSIS — Z0000 Encounter for general adult medical examination without abnormal findings: Secondary | ICD-10-CM | POA: Diagnosis not present

## 2013-11-03 DIAGNOSIS — M5030 Other cervical disc degeneration, unspecified cervical region: Secondary | ICD-10-CM | POA: Diagnosis not present

## 2013-11-03 DIAGNOSIS — K219 Gastro-esophageal reflux disease without esophagitis: Secondary | ICD-10-CM | POA: Diagnosis not present

## 2013-11-03 DIAGNOSIS — Z6833 Body mass index (BMI) 33.0-33.9, adult: Secondary | ICD-10-CM | POA: Diagnosis not present

## 2013-11-03 DIAGNOSIS — Z8546 Personal history of malignant neoplasm of prostate: Secondary | ICD-10-CM | POA: Diagnosis not present

## 2013-11-22 DIAGNOSIS — Z96651 Presence of right artificial knee joint: Secondary | ICD-10-CM | POA: Diagnosis not present

## 2013-12-03 DIAGNOSIS — R040 Epistaxis: Secondary | ICD-10-CM | POA: Diagnosis not present

## 2013-12-03 DIAGNOSIS — R220 Localized swelling, mass and lump, head: Secondary | ICD-10-CM | POA: Diagnosis not present

## 2013-12-03 DIAGNOSIS — W010XXA Fall on same level from slipping, tripping and stumbling without subsequent striking against object, initial encounter: Secondary | ICD-10-CM | POA: Diagnosis not present

## 2013-12-03 DIAGNOSIS — W1839XA Other fall on same level, initial encounter: Secondary | ICD-10-CM | POA: Diagnosis not present

## 2013-12-03 DIAGNOSIS — R079 Chest pain, unspecified: Secondary | ICD-10-CM | POA: Diagnosis not present

## 2013-12-03 DIAGNOSIS — Z7982 Long term (current) use of aspirin: Secondary | ICD-10-CM | POA: Diagnosis not present

## 2013-12-03 DIAGNOSIS — R51 Headache: Secondary | ICD-10-CM | POA: Diagnosis not present

## 2013-12-03 DIAGNOSIS — S299XXA Unspecified injury of thorax, initial encounter: Secondary | ICD-10-CM | POA: Diagnosis not present

## 2013-12-03 DIAGNOSIS — R0781 Pleurodynia: Secondary | ICD-10-CM | POA: Diagnosis not present

## 2013-12-03 DIAGNOSIS — M542 Cervicalgia: Secondary | ICD-10-CM | POA: Diagnosis not present

## 2013-12-04 DIAGNOSIS — W1839XA Other fall on same level, initial encounter: Secondary | ICD-10-CM | POA: Diagnosis not present

## 2013-12-04 DIAGNOSIS — R040 Epistaxis: Secondary | ICD-10-CM | POA: Diagnosis not present

## 2013-12-05 DIAGNOSIS — Z7982 Long term (current) use of aspirin: Secondary | ICD-10-CM | POA: Diagnosis not present

## 2013-12-05 DIAGNOSIS — E780 Pure hypercholesterolemia: Secondary | ICD-10-CM | POA: Diagnosis not present

## 2013-12-05 DIAGNOSIS — R040 Epistaxis: Secondary | ICD-10-CM | POA: Diagnosis not present

## 2013-12-05 DIAGNOSIS — Z79899 Other long term (current) drug therapy: Secondary | ICD-10-CM | POA: Diagnosis not present

## 2013-12-05 DIAGNOSIS — I2510 Atherosclerotic heart disease of native coronary artery without angina pectoris: Secondary | ICD-10-CM | POA: Diagnosis not present

## 2013-12-05 DIAGNOSIS — J342 Deviated nasal septum: Secondary | ICD-10-CM | POA: Diagnosis not present

## 2013-12-07 DIAGNOSIS — R040 Epistaxis: Secondary | ICD-10-CM | POA: Diagnosis not present

## 2013-12-07 DIAGNOSIS — J342 Deviated nasal septum: Secondary | ICD-10-CM | POA: Diagnosis not present

## 2013-12-07 DIAGNOSIS — Z7982 Long term (current) use of aspirin: Secondary | ICD-10-CM | POA: Diagnosis not present

## 2013-12-29 DIAGNOSIS — M5033 Other cervical disc degeneration, cervicothoracic region: Secondary | ICD-10-CM | POA: Diagnosis not present

## 2013-12-29 DIAGNOSIS — M9903 Segmental and somatic dysfunction of lumbar region: Secondary | ICD-10-CM | POA: Diagnosis not present

## 2013-12-29 DIAGNOSIS — M542 Cervicalgia: Secondary | ICD-10-CM | POA: Diagnosis not present

## 2013-12-29 DIAGNOSIS — M5137 Other intervertebral disc degeneration, lumbosacral region: Secondary | ICD-10-CM | POA: Diagnosis not present

## 2013-12-29 DIAGNOSIS — M9901 Segmental and somatic dysfunction of cervical region: Secondary | ICD-10-CM | POA: Diagnosis not present

## 2013-12-29 DIAGNOSIS — M9902 Segmental and somatic dysfunction of thoracic region: Secondary | ICD-10-CM | POA: Diagnosis not present

## 2013-12-29 DIAGNOSIS — M5442 Lumbago with sciatica, left side: Secondary | ICD-10-CM | POA: Diagnosis not present

## 2014-02-17 DIAGNOSIS — M5137 Other intervertebral disc degeneration, lumbosacral region: Secondary | ICD-10-CM | POA: Diagnosis not present

## 2014-02-17 DIAGNOSIS — M9903 Segmental and somatic dysfunction of lumbar region: Secondary | ICD-10-CM | POA: Diagnosis not present

## 2014-02-17 DIAGNOSIS — M5033 Other cervical disc degeneration, cervicothoracic region: Secondary | ICD-10-CM | POA: Diagnosis not present

## 2014-02-17 DIAGNOSIS — M542 Cervicalgia: Secondary | ICD-10-CM | POA: Diagnosis not present

## 2014-02-17 DIAGNOSIS — M9901 Segmental and somatic dysfunction of cervical region: Secondary | ICD-10-CM | POA: Diagnosis not present

## 2014-02-17 DIAGNOSIS — M9902 Segmental and somatic dysfunction of thoracic region: Secondary | ICD-10-CM | POA: Diagnosis not present

## 2014-02-17 DIAGNOSIS — M5442 Lumbago with sciatica, left side: Secondary | ICD-10-CM | POA: Diagnosis not present

## 2014-05-11 DIAGNOSIS — M542 Cervicalgia: Secondary | ICD-10-CM | POA: Diagnosis not present

## 2014-05-11 DIAGNOSIS — M5033 Other cervical disc degeneration, cervicothoracic region: Secondary | ICD-10-CM | POA: Diagnosis not present

## 2014-05-11 DIAGNOSIS — M9901 Segmental and somatic dysfunction of cervical region: Secondary | ICD-10-CM | POA: Diagnosis not present

## 2014-05-11 DIAGNOSIS — M5442 Lumbago with sciatica, left side: Secondary | ICD-10-CM | POA: Diagnosis not present

## 2014-05-11 DIAGNOSIS — M5137 Other intervertebral disc degeneration, lumbosacral region: Secondary | ICD-10-CM | POA: Diagnosis not present

## 2014-05-11 DIAGNOSIS — M9903 Segmental and somatic dysfunction of lumbar region: Secondary | ICD-10-CM | POA: Diagnosis not present

## 2014-05-11 DIAGNOSIS — M9902 Segmental and somatic dysfunction of thoracic region: Secondary | ICD-10-CM | POA: Diagnosis not present

## 2014-06-06 DIAGNOSIS — Z96651 Presence of right artificial knee joint: Secondary | ICD-10-CM | POA: Diagnosis not present

## 2014-08-11 DIAGNOSIS — M5033 Other cervical disc degeneration, cervicothoracic region: Secondary | ICD-10-CM | POA: Diagnosis not present

## 2014-08-11 DIAGNOSIS — M9902 Segmental and somatic dysfunction of thoracic region: Secondary | ICD-10-CM | POA: Diagnosis not present

## 2014-08-11 DIAGNOSIS — M542 Cervicalgia: Secondary | ICD-10-CM | POA: Diagnosis not present

## 2014-08-11 DIAGNOSIS — M5442 Lumbago with sciatica, left side: Secondary | ICD-10-CM | POA: Diagnosis not present

## 2014-08-11 DIAGNOSIS — M5137 Other intervertebral disc degeneration, lumbosacral region: Secondary | ICD-10-CM | POA: Diagnosis not present

## 2014-08-11 DIAGNOSIS — M9901 Segmental and somatic dysfunction of cervical region: Secondary | ICD-10-CM | POA: Diagnosis not present

## 2014-08-11 DIAGNOSIS — M9903 Segmental and somatic dysfunction of lumbar region: Secondary | ICD-10-CM | POA: Diagnosis not present

## 2014-08-24 DIAGNOSIS — N3944 Nocturnal enuresis: Secondary | ICD-10-CM | POA: Diagnosis not present

## 2014-08-24 DIAGNOSIS — Z8546 Personal history of malignant neoplasm of prostate: Secondary | ICD-10-CM | POA: Diagnosis not present

## 2014-08-24 DIAGNOSIS — N401 Enlarged prostate with lower urinary tract symptoms: Secondary | ICD-10-CM | POA: Diagnosis not present

## 2014-08-24 DIAGNOSIS — R351 Nocturia: Secondary | ICD-10-CM | POA: Diagnosis not present

## 2014-08-24 DIAGNOSIS — N138 Other obstructive and reflux uropathy: Secondary | ICD-10-CM | POA: Diagnosis not present

## 2014-10-26 DIAGNOSIS — M9901 Segmental and somatic dysfunction of cervical region: Secondary | ICD-10-CM | POA: Diagnosis not present

## 2014-10-26 DIAGNOSIS — M5033 Other cervical disc degeneration, cervicothoracic region: Secondary | ICD-10-CM | POA: Diagnosis not present

## 2014-10-26 DIAGNOSIS — M5442 Lumbago with sciatica, left side: Secondary | ICD-10-CM | POA: Diagnosis not present

## 2014-10-26 DIAGNOSIS — M9903 Segmental and somatic dysfunction of lumbar region: Secondary | ICD-10-CM | POA: Diagnosis not present

## 2014-10-26 DIAGNOSIS — M5137 Other intervertebral disc degeneration, lumbosacral region: Secondary | ICD-10-CM | POA: Diagnosis not present

## 2014-10-26 DIAGNOSIS — M9902 Segmental and somatic dysfunction of thoracic region: Secondary | ICD-10-CM | POA: Diagnosis not present

## 2014-10-26 DIAGNOSIS — M542 Cervicalgia: Secondary | ICD-10-CM | POA: Diagnosis not present

## 2014-11-21 DIAGNOSIS — Z23 Encounter for immunization: Secondary | ICD-10-CM | POA: Diagnosis not present

## 2014-12-19 DIAGNOSIS — M5033 Other cervical disc degeneration, cervicothoracic region: Secondary | ICD-10-CM | POA: Diagnosis not present

## 2014-12-19 DIAGNOSIS — M9902 Segmental and somatic dysfunction of thoracic region: Secondary | ICD-10-CM | POA: Diagnosis not present

## 2014-12-19 DIAGNOSIS — M5442 Lumbago with sciatica, left side: Secondary | ICD-10-CM | POA: Diagnosis not present

## 2014-12-19 DIAGNOSIS — M9903 Segmental and somatic dysfunction of lumbar region: Secondary | ICD-10-CM | POA: Diagnosis not present

## 2014-12-19 DIAGNOSIS — M542 Cervicalgia: Secondary | ICD-10-CM | POA: Diagnosis not present

## 2014-12-19 DIAGNOSIS — M9901 Segmental and somatic dysfunction of cervical region: Secondary | ICD-10-CM | POA: Diagnosis not present

## 2014-12-19 DIAGNOSIS — M5137 Other intervertebral disc degeneration, lumbosacral region: Secondary | ICD-10-CM | POA: Diagnosis not present

## 2015-04-05 DIAGNOSIS — M5033 Other cervical disc degeneration, cervicothoracic region: Secondary | ICD-10-CM | POA: Diagnosis not present

## 2015-04-05 DIAGNOSIS — M5442 Lumbago with sciatica, left side: Secondary | ICD-10-CM | POA: Diagnosis not present

## 2015-04-05 DIAGNOSIS — M5137 Other intervertebral disc degeneration, lumbosacral region: Secondary | ICD-10-CM | POA: Diagnosis not present

## 2015-04-05 DIAGNOSIS — M9901 Segmental and somatic dysfunction of cervical region: Secondary | ICD-10-CM | POA: Diagnosis not present

## 2015-04-05 DIAGNOSIS — M542 Cervicalgia: Secondary | ICD-10-CM | POA: Diagnosis not present

## 2015-04-05 DIAGNOSIS — M9902 Segmental and somatic dysfunction of thoracic region: Secondary | ICD-10-CM | POA: Diagnosis not present

## 2015-04-05 DIAGNOSIS — M9903 Segmental and somatic dysfunction of lumbar region: Secondary | ICD-10-CM | POA: Diagnosis not present

## 2015-08-31 DIAGNOSIS — Z8546 Personal history of malignant neoplasm of prostate: Secondary | ICD-10-CM | POA: Diagnosis not present

## 2015-09-13 DIAGNOSIS — M9901 Segmental and somatic dysfunction of cervical region: Secondary | ICD-10-CM | POA: Diagnosis not present

## 2015-09-13 DIAGNOSIS — M9902 Segmental and somatic dysfunction of thoracic region: Secondary | ICD-10-CM | POA: Diagnosis not present

## 2015-09-13 DIAGNOSIS — M542 Cervicalgia: Secondary | ICD-10-CM | POA: Diagnosis not present

## 2015-09-13 DIAGNOSIS — M9903 Segmental and somatic dysfunction of lumbar region: Secondary | ICD-10-CM | POA: Diagnosis not present

## 2015-09-13 DIAGNOSIS — M5033 Other cervical disc degeneration, cervicothoracic region: Secondary | ICD-10-CM | POA: Diagnosis not present

## 2015-09-13 DIAGNOSIS — M5137 Other intervertebral disc degeneration, lumbosacral region: Secondary | ICD-10-CM | POA: Diagnosis not present

## 2015-09-13 DIAGNOSIS — M5442 Lumbago with sciatica, left side: Secondary | ICD-10-CM | POA: Diagnosis not present

## 2015-11-29 DIAGNOSIS — Z23 Encounter for immunization: Secondary | ICD-10-CM | POA: Diagnosis not present

## 2016-02-11 HISTORY — PX: SHOULDER ARTHROSCOPY W/ ROTATOR CUFF REPAIR: SHX2400

## 2016-03-24 DIAGNOSIS — M25512 Pain in left shoulder: Secondary | ICD-10-CM | POA: Diagnosis not present

## 2016-03-26 DIAGNOSIS — S32000A Wedge compression fracture of unspecified lumbar vertebra, initial encounter for closed fracture: Secondary | ICD-10-CM | POA: Diagnosis not present

## 2016-03-28 DIAGNOSIS — M75102 Unspecified rotator cuff tear or rupture of left shoulder, not specified as traumatic: Secondary | ICD-10-CM | POA: Diagnosis not present

## 2016-03-28 DIAGNOSIS — M545 Low back pain: Secondary | ICD-10-CM | POA: Diagnosis not present

## 2016-03-31 DIAGNOSIS — M25512 Pain in left shoulder: Secondary | ICD-10-CM | POA: Diagnosis not present

## 2016-03-31 DIAGNOSIS — M75112 Incomplete rotator cuff tear or rupture of left shoulder, not specified as traumatic: Secondary | ICD-10-CM | POA: Diagnosis not present

## 2016-04-02 DIAGNOSIS — S32000A Wedge compression fracture of unspecified lumbar vertebra, initial encounter for closed fracture: Secondary | ICD-10-CM | POA: Diagnosis not present

## 2016-04-07 DIAGNOSIS — M25612 Stiffness of left shoulder, not elsewhere classified: Secondary | ICD-10-CM | POA: Diagnosis not present

## 2016-04-07 DIAGNOSIS — M6281 Muscle weakness (generalized): Secondary | ICD-10-CM | POA: Diagnosis not present

## 2016-04-07 DIAGNOSIS — M25512 Pain in left shoulder: Secondary | ICD-10-CM | POA: Diagnosis not present

## 2016-04-11 DIAGNOSIS — M25612 Stiffness of left shoulder, not elsewhere classified: Secondary | ICD-10-CM | POA: Diagnosis not present

## 2016-04-11 DIAGNOSIS — M6281 Muscle weakness (generalized): Secondary | ICD-10-CM | POA: Diagnosis not present

## 2016-04-11 DIAGNOSIS — M25512 Pain in left shoulder: Secondary | ICD-10-CM | POA: Diagnosis not present

## 2016-04-14 DIAGNOSIS — M545 Low back pain: Secondary | ICD-10-CM | POA: Diagnosis not present

## 2016-04-14 DIAGNOSIS — M48061 Spinal stenosis, lumbar region without neurogenic claudication: Secondary | ICD-10-CM | POA: Diagnosis not present

## 2016-04-17 DIAGNOSIS — M25612 Stiffness of left shoulder, not elsewhere classified: Secondary | ICD-10-CM | POA: Diagnosis not present

## 2016-04-17 DIAGNOSIS — M25512 Pain in left shoulder: Secondary | ICD-10-CM | POA: Diagnosis not present

## 2016-04-17 DIAGNOSIS — M6281 Muscle weakness (generalized): Secondary | ICD-10-CM | POA: Diagnosis not present

## 2016-04-22 DIAGNOSIS — M25612 Stiffness of left shoulder, not elsewhere classified: Secondary | ICD-10-CM | POA: Diagnosis not present

## 2016-04-22 DIAGNOSIS — M6281 Muscle weakness (generalized): Secondary | ICD-10-CM | POA: Diagnosis not present

## 2016-04-22 DIAGNOSIS — M25512 Pain in left shoulder: Secondary | ICD-10-CM | POA: Diagnosis not present

## 2016-04-28 DIAGNOSIS — M75112 Incomplete rotator cuff tear or rupture of left shoulder, not specified as traumatic: Secondary | ICD-10-CM | POA: Diagnosis not present

## 2016-05-01 DIAGNOSIS — E669 Obesity, unspecified: Secondary | ICD-10-CM | POA: Diagnosis not present

## 2016-05-01 DIAGNOSIS — Z9181 History of falling: Secondary | ICD-10-CM | POA: Diagnosis not present

## 2016-05-01 DIAGNOSIS — Z0000 Encounter for general adult medical examination without abnormal findings: Secondary | ICD-10-CM | POA: Diagnosis not present

## 2016-05-01 DIAGNOSIS — K219 Gastro-esophageal reflux disease without esophagitis: Secondary | ICD-10-CM | POA: Diagnosis not present

## 2016-05-01 DIAGNOSIS — Z6834 Body mass index (BMI) 34.0-34.9, adult: Secondary | ICD-10-CM | POA: Diagnosis not present

## 2016-05-01 DIAGNOSIS — Z0289 Encounter for other administrative examinations: Secondary | ICD-10-CM | POA: Diagnosis not present

## 2016-05-01 DIAGNOSIS — I2510 Atherosclerotic heart disease of native coronary artery without angina pectoris: Secondary | ICD-10-CM | POA: Diagnosis not present

## 2016-05-07 DIAGNOSIS — M75112 Incomplete rotator cuff tear or rupture of left shoulder, not specified as traumatic: Secondary | ICD-10-CM | POA: Diagnosis not present

## 2016-05-13 DIAGNOSIS — I2510 Atherosclerotic heart disease of native coronary artery without angina pectoris: Secondary | ICD-10-CM | POA: Diagnosis not present

## 2016-05-13 DIAGNOSIS — M65812 Other synovitis and tenosynovitis, left shoulder: Secondary | ICD-10-CM | POA: Diagnosis not present

## 2016-05-13 DIAGNOSIS — M75112 Incomplete rotator cuff tear or rupture of left shoulder, not specified as traumatic: Secondary | ICD-10-CM | POA: Diagnosis not present

## 2016-05-13 DIAGNOSIS — G8918 Other acute postprocedural pain: Secondary | ICD-10-CM | POA: Diagnosis not present

## 2016-05-13 DIAGNOSIS — Z79899 Other long term (current) drug therapy: Secondary | ICD-10-CM | POA: Diagnosis not present

## 2016-05-13 DIAGNOSIS — Z01818 Encounter for other preprocedural examination: Secondary | ICD-10-CM | POA: Diagnosis not present

## 2016-05-13 DIAGNOSIS — I10 Essential (primary) hypertension: Secondary | ICD-10-CM | POA: Diagnosis not present

## 2016-05-13 DIAGNOSIS — M7542 Impingement syndrome of left shoulder: Secondary | ICD-10-CM | POA: Diagnosis not present

## 2016-05-15 DIAGNOSIS — Z9181 History of falling: Secondary | ICD-10-CM | POA: Diagnosis not present

## 2016-05-15 DIAGNOSIS — M545 Low back pain: Secondary | ICD-10-CM | POA: Diagnosis not present

## 2016-05-15 DIAGNOSIS — I10 Essential (primary) hypertension: Secondary | ICD-10-CM | POA: Diagnosis not present

## 2016-05-15 DIAGNOSIS — Z1211 Encounter for screening for malignant neoplasm of colon: Secondary | ICD-10-CM | POA: Diagnosis not present

## 2016-05-15 DIAGNOSIS — M1990 Unspecified osteoarthritis, unspecified site: Secondary | ICD-10-CM | POA: Diagnosis not present

## 2016-05-15 DIAGNOSIS — Z4889 Encounter for other specified surgical aftercare: Secondary | ICD-10-CM | POA: Diagnosis not present

## 2016-05-15 DIAGNOSIS — M5416 Radiculopathy, lumbar region: Secondary | ICD-10-CM | POA: Diagnosis not present

## 2016-05-15 DIAGNOSIS — Z8546 Personal history of malignant neoplasm of prostate: Secondary | ICD-10-CM | POA: Diagnosis not present

## 2016-05-15 DIAGNOSIS — I2510 Atherosclerotic heart disease of native coronary artery without angina pectoris: Secondary | ICD-10-CM | POA: Diagnosis not present

## 2016-05-20 DIAGNOSIS — M545 Low back pain: Secondary | ICD-10-CM | POA: Diagnosis not present

## 2016-05-20 DIAGNOSIS — M5416 Radiculopathy, lumbar region: Secondary | ICD-10-CM | POA: Diagnosis not present

## 2016-05-20 DIAGNOSIS — I2510 Atherosclerotic heart disease of native coronary artery without angina pectoris: Secondary | ICD-10-CM | POA: Diagnosis not present

## 2016-05-20 DIAGNOSIS — M1990 Unspecified osteoarthritis, unspecified site: Secondary | ICD-10-CM | POA: Diagnosis not present

## 2016-05-20 DIAGNOSIS — I10 Essential (primary) hypertension: Secondary | ICD-10-CM | POA: Diagnosis not present

## 2016-05-20 DIAGNOSIS — Z4889 Encounter for other specified surgical aftercare: Secondary | ICD-10-CM | POA: Diagnosis not present

## 2016-05-22 DIAGNOSIS — I10 Essential (primary) hypertension: Secondary | ICD-10-CM | POA: Diagnosis not present

## 2016-05-22 DIAGNOSIS — Z4889 Encounter for other specified surgical aftercare: Secondary | ICD-10-CM | POA: Diagnosis not present

## 2016-05-22 DIAGNOSIS — M5416 Radiculopathy, lumbar region: Secondary | ICD-10-CM | POA: Diagnosis not present

## 2016-05-22 DIAGNOSIS — M1990 Unspecified osteoarthritis, unspecified site: Secondary | ICD-10-CM | POA: Diagnosis not present

## 2016-05-22 DIAGNOSIS — M545 Low back pain: Secondary | ICD-10-CM | POA: Diagnosis not present

## 2016-05-22 DIAGNOSIS — I2510 Atherosclerotic heart disease of native coronary artery without angina pectoris: Secondary | ICD-10-CM | POA: Diagnosis not present

## 2016-05-27 DIAGNOSIS — M545 Low back pain: Secondary | ICD-10-CM | POA: Diagnosis not present

## 2016-05-27 DIAGNOSIS — M5416 Radiculopathy, lumbar region: Secondary | ICD-10-CM | POA: Diagnosis not present

## 2016-05-27 DIAGNOSIS — Z4889 Encounter for other specified surgical aftercare: Secondary | ICD-10-CM | POA: Diagnosis not present

## 2016-05-27 DIAGNOSIS — M1990 Unspecified osteoarthritis, unspecified site: Secondary | ICD-10-CM | POA: Diagnosis not present

## 2016-05-27 DIAGNOSIS — I10 Essential (primary) hypertension: Secondary | ICD-10-CM | POA: Diagnosis not present

## 2016-05-27 DIAGNOSIS — I2510 Atherosclerotic heart disease of native coronary artery without angina pectoris: Secondary | ICD-10-CM | POA: Diagnosis not present

## 2016-05-29 DIAGNOSIS — M5416 Radiculopathy, lumbar region: Secondary | ICD-10-CM | POA: Diagnosis not present

## 2016-05-29 DIAGNOSIS — I2510 Atherosclerotic heart disease of native coronary artery without angina pectoris: Secondary | ICD-10-CM | POA: Diagnosis not present

## 2016-05-29 DIAGNOSIS — M1990 Unspecified osteoarthritis, unspecified site: Secondary | ICD-10-CM | POA: Diagnosis not present

## 2016-05-29 DIAGNOSIS — Z4889 Encounter for other specified surgical aftercare: Secondary | ICD-10-CM | POA: Diagnosis not present

## 2016-05-29 DIAGNOSIS — I10 Essential (primary) hypertension: Secondary | ICD-10-CM | POA: Diagnosis not present

## 2016-05-29 DIAGNOSIS — M545 Low back pain: Secondary | ICD-10-CM | POA: Diagnosis not present

## 2016-06-03 DIAGNOSIS — I10 Essential (primary) hypertension: Secondary | ICD-10-CM | POA: Diagnosis not present

## 2016-06-03 DIAGNOSIS — I2510 Atherosclerotic heart disease of native coronary artery without angina pectoris: Secondary | ICD-10-CM | POA: Diagnosis not present

## 2016-06-03 DIAGNOSIS — M5416 Radiculopathy, lumbar region: Secondary | ICD-10-CM | POA: Diagnosis not present

## 2016-06-03 DIAGNOSIS — M545 Low back pain: Secondary | ICD-10-CM | POA: Diagnosis not present

## 2016-06-03 DIAGNOSIS — Z4889 Encounter for other specified surgical aftercare: Secondary | ICD-10-CM | POA: Diagnosis not present

## 2016-06-03 DIAGNOSIS — M1990 Unspecified osteoarthritis, unspecified site: Secondary | ICD-10-CM | POA: Diagnosis not present

## 2016-06-05 DIAGNOSIS — I10 Essential (primary) hypertension: Secondary | ICD-10-CM | POA: Diagnosis not present

## 2016-06-05 DIAGNOSIS — M1990 Unspecified osteoarthritis, unspecified site: Secondary | ICD-10-CM | POA: Diagnosis not present

## 2016-06-05 DIAGNOSIS — M5416 Radiculopathy, lumbar region: Secondary | ICD-10-CM | POA: Diagnosis not present

## 2016-06-05 DIAGNOSIS — Z4889 Encounter for other specified surgical aftercare: Secondary | ICD-10-CM | POA: Diagnosis not present

## 2016-06-05 DIAGNOSIS — M545 Low back pain: Secondary | ICD-10-CM | POA: Diagnosis not present

## 2016-06-05 DIAGNOSIS — I2510 Atherosclerotic heart disease of native coronary artery without angina pectoris: Secondary | ICD-10-CM | POA: Diagnosis not present

## 2016-06-10 DIAGNOSIS — M1990 Unspecified osteoarthritis, unspecified site: Secondary | ICD-10-CM | POA: Diagnosis not present

## 2016-06-10 DIAGNOSIS — I2510 Atherosclerotic heart disease of native coronary artery without angina pectoris: Secondary | ICD-10-CM | POA: Diagnosis not present

## 2016-06-10 DIAGNOSIS — M5416 Radiculopathy, lumbar region: Secondary | ICD-10-CM | POA: Diagnosis not present

## 2016-06-10 DIAGNOSIS — I10 Essential (primary) hypertension: Secondary | ICD-10-CM | POA: Diagnosis not present

## 2016-06-10 DIAGNOSIS — Z4889 Encounter for other specified surgical aftercare: Secondary | ICD-10-CM | POA: Diagnosis not present

## 2016-06-10 DIAGNOSIS — M545 Low back pain: Secondary | ICD-10-CM | POA: Diagnosis not present

## 2016-06-12 DIAGNOSIS — M5416 Radiculopathy, lumbar region: Secondary | ICD-10-CM | POA: Diagnosis not present

## 2016-06-12 DIAGNOSIS — M545 Low back pain: Secondary | ICD-10-CM | POA: Diagnosis not present

## 2016-06-12 DIAGNOSIS — I10 Essential (primary) hypertension: Secondary | ICD-10-CM | POA: Diagnosis not present

## 2016-06-12 DIAGNOSIS — I2510 Atherosclerotic heart disease of native coronary artery without angina pectoris: Secondary | ICD-10-CM | POA: Diagnosis not present

## 2016-06-12 DIAGNOSIS — Z4889 Encounter for other specified surgical aftercare: Secondary | ICD-10-CM | POA: Diagnosis not present

## 2016-06-12 DIAGNOSIS — M1990 Unspecified osteoarthritis, unspecified site: Secondary | ICD-10-CM | POA: Diagnosis not present

## 2016-06-24 DIAGNOSIS — I10 Essential (primary) hypertension: Secondary | ICD-10-CM | POA: Diagnosis not present

## 2016-06-24 DIAGNOSIS — M545 Low back pain: Secondary | ICD-10-CM | POA: Diagnosis not present

## 2016-06-24 DIAGNOSIS — Z4889 Encounter for other specified surgical aftercare: Secondary | ICD-10-CM | POA: Diagnosis not present

## 2016-06-24 DIAGNOSIS — I2510 Atherosclerotic heart disease of native coronary artery without angina pectoris: Secondary | ICD-10-CM | POA: Diagnosis not present

## 2016-06-24 DIAGNOSIS — M5416 Radiculopathy, lumbar region: Secondary | ICD-10-CM | POA: Diagnosis not present

## 2016-06-24 DIAGNOSIS — M1990 Unspecified osteoarthritis, unspecified site: Secondary | ICD-10-CM | POA: Diagnosis not present

## 2016-06-26 DIAGNOSIS — I2510 Atherosclerotic heart disease of native coronary artery without angina pectoris: Secondary | ICD-10-CM | POA: Diagnosis not present

## 2016-06-26 DIAGNOSIS — I10 Essential (primary) hypertension: Secondary | ICD-10-CM | POA: Diagnosis not present

## 2016-06-26 DIAGNOSIS — M1990 Unspecified osteoarthritis, unspecified site: Secondary | ICD-10-CM | POA: Diagnosis not present

## 2016-06-26 DIAGNOSIS — M5416 Radiculopathy, lumbar region: Secondary | ICD-10-CM | POA: Diagnosis not present

## 2016-06-26 DIAGNOSIS — Z4889 Encounter for other specified surgical aftercare: Secondary | ICD-10-CM | POA: Diagnosis not present

## 2016-06-26 DIAGNOSIS — M545 Low back pain: Secondary | ICD-10-CM | POA: Diagnosis not present

## 2016-07-01 DIAGNOSIS — M1990 Unspecified osteoarthritis, unspecified site: Secondary | ICD-10-CM | POA: Diagnosis not present

## 2016-07-01 DIAGNOSIS — I10 Essential (primary) hypertension: Secondary | ICD-10-CM | POA: Diagnosis not present

## 2016-07-01 DIAGNOSIS — M5416 Radiculopathy, lumbar region: Secondary | ICD-10-CM | POA: Diagnosis not present

## 2016-07-01 DIAGNOSIS — I2510 Atherosclerotic heart disease of native coronary artery without angina pectoris: Secondary | ICD-10-CM | POA: Diagnosis not present

## 2016-07-01 DIAGNOSIS — Z4889 Encounter for other specified surgical aftercare: Secondary | ICD-10-CM | POA: Diagnosis not present

## 2016-07-01 DIAGNOSIS — M545 Low back pain: Secondary | ICD-10-CM | POA: Diagnosis not present

## 2016-07-03 DIAGNOSIS — M5416 Radiculopathy, lumbar region: Secondary | ICD-10-CM | POA: Diagnosis not present

## 2016-07-03 DIAGNOSIS — M1990 Unspecified osteoarthritis, unspecified site: Secondary | ICD-10-CM | POA: Diagnosis not present

## 2016-07-03 DIAGNOSIS — I2510 Atherosclerotic heart disease of native coronary artery without angina pectoris: Secondary | ICD-10-CM | POA: Diagnosis not present

## 2016-07-03 DIAGNOSIS — I10 Essential (primary) hypertension: Secondary | ICD-10-CM | POA: Diagnosis not present

## 2016-07-03 DIAGNOSIS — M545 Low back pain: Secondary | ICD-10-CM | POA: Diagnosis not present

## 2016-07-03 DIAGNOSIS — Z4889 Encounter for other specified surgical aftercare: Secondary | ICD-10-CM | POA: Diagnosis not present

## 2016-07-08 DIAGNOSIS — I10 Essential (primary) hypertension: Secondary | ICD-10-CM | POA: Diagnosis not present

## 2016-07-08 DIAGNOSIS — Z4889 Encounter for other specified surgical aftercare: Secondary | ICD-10-CM | POA: Diagnosis not present

## 2016-07-08 DIAGNOSIS — M545 Low back pain: Secondary | ICD-10-CM | POA: Diagnosis not present

## 2016-07-08 DIAGNOSIS — M5416 Radiculopathy, lumbar region: Secondary | ICD-10-CM | POA: Diagnosis not present

## 2016-07-08 DIAGNOSIS — M1990 Unspecified osteoarthritis, unspecified site: Secondary | ICD-10-CM | POA: Diagnosis not present

## 2016-07-08 DIAGNOSIS — I2510 Atherosclerotic heart disease of native coronary artery without angina pectoris: Secondary | ICD-10-CM | POA: Diagnosis not present

## 2016-07-10 DIAGNOSIS — I10 Essential (primary) hypertension: Secondary | ICD-10-CM | POA: Diagnosis not present

## 2016-07-10 DIAGNOSIS — Z4889 Encounter for other specified surgical aftercare: Secondary | ICD-10-CM | POA: Diagnosis not present

## 2016-07-10 DIAGNOSIS — M545 Low back pain: Secondary | ICD-10-CM | POA: Diagnosis not present

## 2016-07-10 DIAGNOSIS — M5416 Radiculopathy, lumbar region: Secondary | ICD-10-CM | POA: Diagnosis not present

## 2016-07-10 DIAGNOSIS — M1990 Unspecified osteoarthritis, unspecified site: Secondary | ICD-10-CM | POA: Diagnosis not present

## 2016-07-10 DIAGNOSIS — I2510 Atherosclerotic heart disease of native coronary artery without angina pectoris: Secondary | ICD-10-CM | POA: Diagnosis not present

## 2016-07-14 DIAGNOSIS — M545 Low back pain: Secondary | ICD-10-CM | POA: Diagnosis not present

## 2016-07-14 DIAGNOSIS — I10 Essential (primary) hypertension: Secondary | ICD-10-CM | POA: Diagnosis not present

## 2016-07-14 DIAGNOSIS — Z8546 Personal history of malignant neoplasm of prostate: Secondary | ICD-10-CM | POA: Diagnosis not present

## 2016-07-14 DIAGNOSIS — M1990 Unspecified osteoarthritis, unspecified site: Secondary | ICD-10-CM | POA: Diagnosis not present

## 2016-07-14 DIAGNOSIS — Z9181 History of falling: Secondary | ICD-10-CM | POA: Diagnosis not present

## 2016-07-14 DIAGNOSIS — I2510 Atherosclerotic heart disease of native coronary artery without angina pectoris: Secondary | ICD-10-CM | POA: Diagnosis not present

## 2016-07-14 DIAGNOSIS — Z1211 Encounter for screening for malignant neoplasm of colon: Secondary | ICD-10-CM | POA: Diagnosis not present

## 2016-07-14 DIAGNOSIS — M5416 Radiculopathy, lumbar region: Secondary | ICD-10-CM | POA: Diagnosis not present

## 2016-07-14 DIAGNOSIS — Z4889 Encounter for other specified surgical aftercare: Secondary | ICD-10-CM | POA: Diagnosis not present

## 2016-07-17 DIAGNOSIS — I10 Essential (primary) hypertension: Secondary | ICD-10-CM | POA: Diagnosis not present

## 2016-07-17 DIAGNOSIS — M5416 Radiculopathy, lumbar region: Secondary | ICD-10-CM | POA: Diagnosis not present

## 2016-07-17 DIAGNOSIS — M545 Low back pain: Secondary | ICD-10-CM | POA: Diagnosis not present

## 2016-07-17 DIAGNOSIS — Z4889 Encounter for other specified surgical aftercare: Secondary | ICD-10-CM | POA: Diagnosis not present

## 2016-07-17 DIAGNOSIS — M1990 Unspecified osteoarthritis, unspecified site: Secondary | ICD-10-CM | POA: Diagnosis not present

## 2016-07-17 DIAGNOSIS — I2510 Atherosclerotic heart disease of native coronary artery without angina pectoris: Secondary | ICD-10-CM | POA: Diagnosis not present

## 2016-07-18 DIAGNOSIS — I10 Essential (primary) hypertension: Secondary | ICD-10-CM | POA: Diagnosis not present

## 2016-07-18 DIAGNOSIS — I2510 Atherosclerotic heart disease of native coronary artery without angina pectoris: Secondary | ICD-10-CM | POA: Diagnosis not present

## 2016-07-18 DIAGNOSIS — Z4889 Encounter for other specified surgical aftercare: Secondary | ICD-10-CM | POA: Diagnosis not present

## 2016-07-18 DIAGNOSIS — M1990 Unspecified osteoarthritis, unspecified site: Secondary | ICD-10-CM | POA: Diagnosis not present

## 2016-07-18 DIAGNOSIS — M5416 Radiculopathy, lumbar region: Secondary | ICD-10-CM | POA: Diagnosis not present

## 2016-07-18 DIAGNOSIS — M545 Low back pain: Secondary | ICD-10-CM | POA: Diagnosis not present

## 2016-07-22 DIAGNOSIS — I10 Essential (primary) hypertension: Secondary | ICD-10-CM | POA: Diagnosis not present

## 2016-07-22 DIAGNOSIS — M545 Low back pain: Secondary | ICD-10-CM | POA: Diagnosis not present

## 2016-07-22 DIAGNOSIS — I2510 Atherosclerotic heart disease of native coronary artery without angina pectoris: Secondary | ICD-10-CM | POA: Diagnosis not present

## 2016-07-22 DIAGNOSIS — Z4889 Encounter for other specified surgical aftercare: Secondary | ICD-10-CM | POA: Diagnosis not present

## 2016-07-22 DIAGNOSIS — M1990 Unspecified osteoarthritis, unspecified site: Secondary | ICD-10-CM | POA: Diagnosis not present

## 2016-07-22 DIAGNOSIS — M5416 Radiculopathy, lumbar region: Secondary | ICD-10-CM | POA: Diagnosis not present

## 2016-07-24 DIAGNOSIS — M1990 Unspecified osteoarthritis, unspecified site: Secondary | ICD-10-CM | POA: Diagnosis not present

## 2016-07-24 DIAGNOSIS — M5416 Radiculopathy, lumbar region: Secondary | ICD-10-CM | POA: Diagnosis not present

## 2016-07-24 DIAGNOSIS — I2510 Atherosclerotic heart disease of native coronary artery without angina pectoris: Secondary | ICD-10-CM | POA: Diagnosis not present

## 2016-07-24 DIAGNOSIS — Z4889 Encounter for other specified surgical aftercare: Secondary | ICD-10-CM | POA: Diagnosis not present

## 2016-07-24 DIAGNOSIS — I10 Essential (primary) hypertension: Secondary | ICD-10-CM | POA: Diagnosis not present

## 2016-07-24 DIAGNOSIS — M545 Low back pain: Secondary | ICD-10-CM | POA: Diagnosis not present

## 2016-07-29 DIAGNOSIS — M545 Low back pain: Secondary | ICD-10-CM | POA: Diagnosis not present

## 2016-07-29 DIAGNOSIS — I2510 Atherosclerotic heart disease of native coronary artery without angina pectoris: Secondary | ICD-10-CM | POA: Diagnosis not present

## 2016-07-29 DIAGNOSIS — Z4889 Encounter for other specified surgical aftercare: Secondary | ICD-10-CM | POA: Diagnosis not present

## 2016-07-29 DIAGNOSIS — M5416 Radiculopathy, lumbar region: Secondary | ICD-10-CM | POA: Diagnosis not present

## 2016-07-29 DIAGNOSIS — M1990 Unspecified osteoarthritis, unspecified site: Secondary | ICD-10-CM | POA: Diagnosis not present

## 2016-07-29 DIAGNOSIS — I10 Essential (primary) hypertension: Secondary | ICD-10-CM | POA: Diagnosis not present

## 2016-07-31 DIAGNOSIS — I10 Essential (primary) hypertension: Secondary | ICD-10-CM | POA: Diagnosis not present

## 2016-07-31 DIAGNOSIS — Z4889 Encounter for other specified surgical aftercare: Secondary | ICD-10-CM | POA: Diagnosis not present

## 2016-07-31 DIAGNOSIS — M5416 Radiculopathy, lumbar region: Secondary | ICD-10-CM | POA: Diagnosis not present

## 2016-07-31 DIAGNOSIS — I2510 Atherosclerotic heart disease of native coronary artery without angina pectoris: Secondary | ICD-10-CM | POA: Diagnosis not present

## 2016-07-31 DIAGNOSIS — M1990 Unspecified osteoarthritis, unspecified site: Secondary | ICD-10-CM | POA: Diagnosis not present

## 2016-07-31 DIAGNOSIS — M545 Low back pain: Secondary | ICD-10-CM | POA: Diagnosis not present

## 2016-08-05 DIAGNOSIS — M5416 Radiculopathy, lumbar region: Secondary | ICD-10-CM | POA: Diagnosis not present

## 2016-08-05 DIAGNOSIS — Z4889 Encounter for other specified surgical aftercare: Secondary | ICD-10-CM | POA: Diagnosis not present

## 2016-08-05 DIAGNOSIS — I10 Essential (primary) hypertension: Secondary | ICD-10-CM | POA: Diagnosis not present

## 2016-08-05 DIAGNOSIS — M545 Low back pain: Secondary | ICD-10-CM | POA: Diagnosis not present

## 2016-08-05 DIAGNOSIS — I2510 Atherosclerotic heart disease of native coronary artery without angina pectoris: Secondary | ICD-10-CM | POA: Diagnosis not present

## 2016-08-05 DIAGNOSIS — M1990 Unspecified osteoarthritis, unspecified site: Secondary | ICD-10-CM | POA: Diagnosis not present

## 2016-08-06 DIAGNOSIS — G5603 Carpal tunnel syndrome, bilateral upper limbs: Secondary | ICD-10-CM | POA: Diagnosis not present

## 2016-08-08 DIAGNOSIS — I10 Essential (primary) hypertension: Secondary | ICD-10-CM | POA: Diagnosis not present

## 2016-08-08 DIAGNOSIS — Z4889 Encounter for other specified surgical aftercare: Secondary | ICD-10-CM | POA: Diagnosis not present

## 2016-08-08 DIAGNOSIS — I2510 Atherosclerotic heart disease of native coronary artery without angina pectoris: Secondary | ICD-10-CM | POA: Diagnosis not present

## 2016-08-08 DIAGNOSIS — M5416 Radiculopathy, lumbar region: Secondary | ICD-10-CM | POA: Diagnosis not present

## 2016-08-08 DIAGNOSIS — M1990 Unspecified osteoarthritis, unspecified site: Secondary | ICD-10-CM | POA: Diagnosis not present

## 2016-08-08 DIAGNOSIS — M545 Low back pain: Secondary | ICD-10-CM | POA: Diagnosis not present

## 2016-08-27 DIAGNOSIS — M5412 Radiculopathy, cervical region: Secondary | ICD-10-CM | POA: Diagnosis not present

## 2016-08-27 DIAGNOSIS — M75122 Complete rotator cuff tear or rupture of left shoulder, not specified as traumatic: Secondary | ICD-10-CM | POA: Diagnosis not present

## 2016-08-27 DIAGNOSIS — R200 Anesthesia of skin: Secondary | ICD-10-CM | POA: Diagnosis not present

## 2016-09-08 DIAGNOSIS — R200 Anesthesia of skin: Secondary | ICD-10-CM | POA: Diagnosis not present

## 2016-09-08 DIAGNOSIS — Z9889 Other specified postprocedural states: Secondary | ICD-10-CM | POA: Diagnosis not present

## 2016-09-08 DIAGNOSIS — M5412 Radiculopathy, cervical region: Secondary | ICD-10-CM | POA: Diagnosis not present

## 2016-09-22 DIAGNOSIS — Z8546 Personal history of malignant neoplasm of prostate: Secondary | ICD-10-CM | POA: Diagnosis not present

## 2016-11-24 DIAGNOSIS — Z23 Encounter for immunization: Secondary | ICD-10-CM | POA: Diagnosis not present

## 2016-12-24 DIAGNOSIS — Z8546 Personal history of malignant neoplasm of prostate: Secondary | ICD-10-CM | POA: Diagnosis not present

## 2016-12-24 DIAGNOSIS — E669 Obesity, unspecified: Secondary | ICD-10-CM | POA: Diagnosis not present

## 2016-12-24 DIAGNOSIS — Z9181 History of falling: Secondary | ICD-10-CM | POA: Diagnosis not present

## 2016-12-24 DIAGNOSIS — Z6834 Body mass index (BMI) 34.0-34.9, adult: Secondary | ICD-10-CM | POA: Diagnosis not present

## 2016-12-24 DIAGNOSIS — I2510 Atherosclerotic heart disease of native coronary artery without angina pectoris: Secondary | ICD-10-CM | POA: Diagnosis not present

## 2016-12-24 DIAGNOSIS — K219 Gastro-esophageal reflux disease without esophagitis: Secondary | ICD-10-CM | POA: Diagnosis not present

## 2016-12-24 DIAGNOSIS — Z0000 Encounter for general adult medical examination without abnormal findings: Secondary | ICD-10-CM | POA: Diagnosis not present

## 2017-08-07 DIAGNOSIS — I4891 Unspecified atrial fibrillation: Secondary | ICD-10-CM

## 2017-08-07 DIAGNOSIS — Z9861 Coronary angioplasty status: Secondary | ICD-10-CM

## 2017-08-07 DIAGNOSIS — I350 Nonrheumatic aortic (valve) stenosis: Secondary | ICD-10-CM

## 2017-08-07 DIAGNOSIS — I2510 Atherosclerotic heart disease of native coronary artery without angina pectoris: Secondary | ICD-10-CM

## 2017-08-07 DIAGNOSIS — M1990 Unspecified osteoarthritis, unspecified site: Secondary | ICD-10-CM

## 2017-08-07 HISTORY — DX: Unspecified osteoarthritis, unspecified site: M19.90

## 2017-08-07 HISTORY — DX: Unspecified atrial fibrillation: I48.91

## 2017-08-07 HISTORY — DX: Atherosclerotic heart disease of native coronary artery without angina pectoris: I25.10

## 2017-08-07 HISTORY — DX: Nonrheumatic aortic (valve) stenosis: I35.0

## 2017-08-25 ENCOUNTER — Encounter: Payer: Self-pay | Admitting: Cardiology

## 2017-08-25 ENCOUNTER — Ambulatory Visit: Payer: Medicare HMO | Admitting: Cardiology

## 2017-08-25 VITALS — BP 152/76 | HR 73 | Ht 70.0 in | Wt 212.0 lb

## 2017-08-25 DIAGNOSIS — I2510 Atherosclerotic heart disease of native coronary artery without angina pectoris: Secondary | ICD-10-CM | POA: Diagnosis not present

## 2017-08-25 DIAGNOSIS — I350 Nonrheumatic aortic (valve) stenosis: Secondary | ICD-10-CM

## 2017-08-25 DIAGNOSIS — I481 Persistent atrial fibrillation: Secondary | ICD-10-CM | POA: Diagnosis not present

## 2017-08-25 DIAGNOSIS — I4819 Other persistent atrial fibrillation: Secondary | ICD-10-CM
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
## 2017-08-25 NOTE — Patient Instructions (Addendum)
Medication Instructions:  Your physician recommends that you continue on your current medications as directed. Please refer to the Current Medication list given to you today.  Labwork: None  Testing/Procedures: Your physician has requested that you have an echocardiogram. Echocardiography is a painless test that uses sound waves to create images of your heart. It provides your doctor with information about the size and shape of your heart and how well your heart's chambers and valves are working. This procedure takes approximately one hour. There are no restrictions for this procedure.  Follow-Up: Your physician recommends that you schedule a follow-up appointment in: 6 months  Any Other Special Instructions Will Be Listed Below (If Applicable).   Referral Orders     Ambulatory referral to Cardiothoracic Surgery If their office does not contact you within 1 week with an appointment please call our office.  If you need a refill on your cardiac medications before your next appointment, please call your pharmacy.   [place], RN, BSN

## 2017-09-03 DIAGNOSIS — M25562 Pain in left knee: Secondary | ICD-10-CM | POA: Diagnosis not present

## 2017-09-03 DIAGNOSIS — S99911A Unspecified injury of right ankle, initial encounter: Secondary | ICD-10-CM | POA: Diagnosis not present

## 2017-09-24 ENCOUNTER — Other Ambulatory Visit: Payer: Medicare HMO

## 2017-09-24 DIAGNOSIS — Z8546 Personal history of malignant neoplasm of prostate: Secondary | ICD-10-CM | POA: Diagnosis not present

## 2017-09-24 DIAGNOSIS — N3944 Nocturnal enuresis: Secondary | ICD-10-CM | POA: Diagnosis not present

## 2017-09-25 ENCOUNTER — Ambulatory Visit (INDEPENDENT_AMBULATORY_CARE_PROVIDER_SITE_OTHER): Payer: Medicare HMO

## 2017-09-25 ENCOUNTER — Other Ambulatory Visit: Payer: Self-pay

## 2017-09-25 DIAGNOSIS — I350 Nonrheumatic aortic (valve) stenosis: Secondary | ICD-10-CM

## 2017-09-25 NOTE — Progress Notes (Addendum)
Echocardiogram has been performed.  Jimmy Brevin Mcfadden RDCS 

## 2017-09-30 ENCOUNTER — Institutional Professional Consult (permissible substitution): Payer: Medicare HMO | Admitting: Surgery

## 2017-09-30 ENCOUNTER — Encounter: Payer: Self-pay | Admitting: Surgery

## 2017-09-30 ENCOUNTER — Other Ambulatory Visit: Payer: Self-pay

## 2017-09-30 VITALS — BP 132/80 | HR 54 | Resp 20 | Ht 70.0 in | Wt 210.8 lb

## 2017-09-30 DIAGNOSIS — I350 Nonrheumatic aortic (valve) stenosis: Secondary | ICD-10-CM

## 2017-10-03 ENCOUNTER — Encounter: Payer: Self-pay | Admitting: Surgery

## 2017-10-03 NOTE — Progress Notes (Signed)
Patient ID: Eric Perry, male   DOB: 1940/11/09, 77 y.o.   MRN: [id number]  [facility] CONSULTATION REPORT  Referring Provider is Revankar, Reita Cliche, MD PCP is Greig Right, MD  Chief Complaint  Patient presents with  . Aortic Stenosis    new patient consultation, ECHO 8/16    HPI:  The patient is a 77 year old gentleman with a history of HTN, hyperlipidemia, DM on no meds, chronic atrial fibrillation on Eliquis, aortic stenosis and coronary artery disease who has been followed at the [facility] in [place]. He reportedly had a coronary stent in 2001. He had an echo in October 2018 showing severe AS with a mean gradient of 47 mm Hg and an AVA of 0.9 cm2 with mild AI. He had a cath in November 2018 that by the report showed a totally occluded RCA with collaterals from the left and right and a 40% OM1. He says that he was asymptomatic so the plan was for close observation. He was seen by cardiology at the [facility] in April 2019 and according to their note was asymptomatic and the plan was for continued follow up but he doesn't think another echo was done. He was sent to Dr. Geraldo Pitter in July by his PCP. An echo on 09/25/2017 showed a trileaflet aortic valve with severely thickened and moderately calcified leaflets with a mean gradient of 38-42 mm Hg and an AVA of 0.84-0.91. DI was 0.31. There was mild AI. There was mild MR with a severely dilated LA. The mitral valve was calcified with a mean gradient of 5 mm Hg and a peak of 15 mm Hg.  LVEF was 55-60% with moderate LVH. He has been referred for consideration of AVR.  The patient is fairly sedentary due to orthopedic problems with bilateral knee replacements, left shoulder surgery and a back injury. He does not complain of any shortness of breath or fatigue but his wife and daughter report that he does get short of breath with activity and is more fatigued than he was a year ago,  naps a lot. He denies any chest pain or dizziness.    Past Medical History:  Diagnosis Date  . Aortic stenosis 08/07/2017  . Atrial fibrillation ([place]) 08/07/2017  . CAD (coronary artery disease) 08/07/2017  . Osteoarthritis 08/07/2017    Past Surgical History:  Procedure Laterality Date  . CARDIAC CATHETERIZATION    . REPLACEMENT TOTAL KNEE BILATERAL    . SHOULDER SURGERY Left 2018    Family History  Problem Relation Age of Onset  . CAD Neg Hx   . Sudden Cardiac Death Neg Hx     Social History   Socioeconomic History  . Marital status: Married    Spouse name: Not on file  . Number of children: Not on file  . Years of education: Not on file  . Highest education level: Not on file  Occupational History  . Not on file  Social Needs  . Financial resource strain: Not on file  . Food insecurity:    Worry: Not on file    Inability: Not on file  . Transportation needs:    Medical: Not on file    Non-medical: Not on file  Tobacco Use  . Smoking status: Former Smoker    Last attempt to quit: 1999    Years since quitting: 20.6  . Smokeless tobacco: Never Used  Substance and Sexual Activity  . Alcohol use:  Yes    Comment: occasional  . Drug use: Not Currently  . Sexual activity: Not on file  Lifestyle  . Physical activity:    Days per week: Not on file    Minutes per session: Not on file  . Stress: Not on file  Relationships  . Social connections:    Talks on phone: Not on file    Gets together: Not on file    Attends religious service: Not on file    Active member of club or organization: Not on file    Attends meetings of clubs or organizations: Not on file    Relationship status: Not on file  . Intimate partner violence:    Fear of current or ex partner: Not on file    Emotionally abused: Not on file    Physically abused: Not on file    Forced sexual activity: Not on file  Other Topics Concern  . Not on file  Social History Narrative  . Not on file     Current Outpatient Medications  Medication Sig Dispense Refill  . apixaban (ELIQUIS) 5 MG TABS tablet Take 5 mg by mouth 2 (two) times daily.    Marland Kitchen aspirin 81 MG tablet Take by mouth daily.    . lansoprazole (PREVACID) 15 MG capsule Take 15 mg by mouth daily at 12 noon.    Marland Kitchen morphine (MSIR) 15 MG tablet Take 15 mg by mouth every 4 (four) hours as needed for severe pain.    . Omega-3 Fatty Acids (FISH OIL) 1000 MG CAPS Take 1 capsule by mouth 2 (two) times daily.    . simvastatin (ZOCOR) 40 MG tablet Take 40 mg by mouth daily.    . tamsulosin (FLOMAX) 0.4 MG CAPS capsule Take 0.4 mg by mouth daily after supper.     No current facility-administered medications for this visit.     Allergies  Allergen Reactions  . Rocephin [Ceftriaxone Sodium In Dextrose]       Review of Systems:   General:  normal appetite, decreased energy, no weight gain, no weight loss, no fever  Cardiac:  no chest pain with exertion, no chest pain at rest, + SOB with  exertion, no resting SOB, no PND, no orthopnea, no palpitations, + arrhythmia, + atrial fibrillation, + LE edema, no dizzy spells, no syncope  Respiratory:  exertional shortness of breath, no home oxygen, no productive cough, no dry cough, no bronchitis, no wheezing, no hemoptysis, no asthma, no pain with inspiration or cough, no sleep apnea, no CPAP at night  GI:   no difficulty swallowing, no reflux, no frequent heartburn, + hiatal hernia, no abdominal pain, no constipation, no diarrhea, no hematochezia, no hematemesis, no melena  GU:   no dysuria,  + frequency, + urinary tract infection, no hematuria, no enlarged prostate, no kidney stones, no kidney disease  Vascular:  no pain suggestive of claudication, + pain in feet, no leg cramps, no varicose veins, no DVT, no non-healing foot ulcer  Neuro:   no stroke, no TIA's, no seizures, no headaches, no temporary blindness one eye,  no slurred speech, + peripheral neuropathy, + chronic pain, + instability  of gait, no memory/cognitive dysfunction  Musculoskeletal: + arthritis, + joint swelling, + myalgias, + difficulty walking, reduced mobility   Skin:   no rash, no itching, no skin infections, no pressure sores or ulcerations  Psych:   no anxiety, no depression, no nervousness, no unusual recent stress  Eyes:   no blurry vision, +  floaters, no recent vision changes, + wears glasses   ENT:   + hearing loss, no loose or painful teeth, + dentures, last saw dentist 1 year ago  Hematologic:  + easy bruising, no abnormal bleeding, no clotting disorder, no frequent epistaxis  Endocrine:  no diabetes, does not check CBG's at home           Physical Exam:   BP 132/80 (BP Location: Left Arm, Patient Position: Sitting, Cuff Size: Normal)   Pulse (!) 54   Resp 20   Ht 5\' 10"  (1.778 m)   Wt 210 lb 12.8 oz (95.6 kg)   SpO2 95% Comment: RA  BMI 30.25 kg/m   General:  Elderly somewhat frail-appearing  HEENT:  Unremarkable, NCAT, PERLA, EOMI, oropharynx clear  Neck:   no JVD, no bruits, no adenopathy or thyromegaly  Chest:   clear to auscultation, symmetrical breath sounds, no wheezes, no rhonchi   CV:   RRR, grade III/VI crescendo/decrescendo murmur heard best at RSB,  no diastolic murmur  Abdomen:  soft, non-tender, no masses or organomegaly  Extremities:  warm, well-perfused, pulses diminished but palpable, trace LE edema  Rectal/GU  Deferred  Neuro:   Grossly non-focal and symmetrical throughout  Skin:   Clean and dry, no rashes, no breakdown   Diagnostic Tests:  Result status: Final result                     *[facility] [place], [place] [address]                            [phone]  ------------------------------------------------------------------- Transthoracic Echocardiography  Patient:    O&'Malley, Anterrio Mccleery MR #:       [id number] Study Date: 09/25/2017 Gender:     M Age:        65 Height:      177.8 cm Weight:     96.2 kg BSA:        2.2 m^2 Pt. Status: Room:   ATTENDING    Jyl Heinz, MD  ORDERING     Jyl Heinz, MD  REFERRING    Jyl Heinz, MD  SONOGRAPHER  Joanie Coddington, RDCS  PERFORMING   [facility] [place]  cc:  ------------------------------------------------------------------- LV EF: 55% -   60%  ------------------------------------------------------------------- Indications:      Aortic stenosis 424.1.  ------------------------------------------------------------------- History:   PMH:   Atrial fibrillation.  Coronary artery disease.   ------------------------------------------------------------------- Study Conclusions  - Left ventricle: The cavity size was normal. Wall thickness was   increased in a pattern of moderate LVH. Systolic function was   normal. The estimated ejection fraction was in the range of 55%   to 60%. Wall motion was normal; there were no regional wall   motion abnormalities. The study is not technically sufficient to   allow evaluation of LV diastolic function. - Aortic valve: Valve mobility was restricted. There was severe   stenosis. There was mild regurgitation. Valve area (VTI): 1.25   cm^2. Valve area (Vmax): 1.29 cm^2. Valve area (Vmean): 1.35   cm^2. - Mitral valve: There was mild regurgitation. Valve area by  continuity equation (using LVOT flow): 2.62 cm^2. - Left atrium: The atrium was severely dilated.  Impressions:  - Severe AS.   Normal LVEF.   Severe LAE.  ------------------------------------------------------------------- Study data:  No prior study was available for comparison.  Study status:  Routine.  Procedure:  The patient reported no pain pre or post test. Transthoracic echocardiography. Image quality was adequate.  Study completion:  There were no complications. Transthoracic echocardiography.  M-mode, complete 2D, spectral Doppler, and color Doppler.  Birthdate:  Patient  birthdate: Sep 24, 1940.  Age:  Patient is 77 yr old.  Sex:  Gender: male. BMI: 30.4 kg/m^2.  Blood pressure:     152/76  Patient status: Outpatient.  Study date:  Study date: 09/25/2017. Study time: 03:08 PM.  Location:  Echo laboratory.  -------------------------------------------------------------------  ------------------------------------------------------------------- Left ventricle:  The cavity size was normal. Wall thickness was increased in a pattern of moderate LVH. Systolic function was normal. The estimated ejection fraction was in the range of 55% to 60%. Wall motion was normal; there were no regional wall motion abnormalities. The study is not technically sufficient to allow evaluation of LV diastolic function.  ------------------------------------------------------------------- Aortic valve:   Trileaflet; severely thickened, moderately calcified leaflets. Valve mobility was restricted.  Doppler: There was severe stenosis.   There was mild regurgitation.    VTI ratio of LVOT to aortic valve: 0.3. Valve area (VTI): 1.25 cm^2. Indexed valve area (VTI): 0.57 cm^2/m^2. Peak velocity ratio of LVOT to aortic valve: 0.31. Valve area (Vmax): 1.29 cm^2. Indexed valve area (Vmax): 0.59 cm^2/m^2. Mean velocity ratio of LVOT to aortic valve: 0.32. Valve area (Vmean): 1.35 cm^2. Indexed valve area (Vmean): 0.61 cm^2/m^2.    Mean gradient (S): 30 mm Hg. Peak gradient (S): 68 mm Hg.  ------------------------------------------------------------------- Aorta:  Aortic root: The aortic root was normal in size.  ------------------------------------------------------------------- Mitral valve:   Structurally normal valve.   Mobility was not restricted.  Doppler:  Transvalvular velocity was within the normal range. There was no evidence for stenosis. There was mild regurgitation.    Valve area by pressure half-time: 3.1 cm^2. Indexed valve area by pressure half-time: 1.41 cm^2/m^2.  Valve area by continuity equation (using LVOT flow): 2.62 cm^2. Indexed valve area by continuity equation (using LVOT flow): 1.19 cm^2/m^2. Mean gradient (D): 6 mm Hg. Peak gradient (D): 8 mm Hg.  ------------------------------------------------------------------- Left atrium:  The atrium was severely dilated.  ------------------------------------------------------------------- Right ventricle:  The cavity size was normal. Wall thickness was normal. Systolic function was normal.  ------------------------------------------------------------------- Pulmonic valve:    Structurally normal valve.   Cusp separation was normal.  Doppler:  Transvalvular velocity was within the normal range. There was no evidence for stenosis. There was no regurgitation.  ------------------------------------------------------------------- Tricuspid valve:   Structurally normal valve.    Doppler: Transvalvular velocity was within the normal range. There was trivial regurgitation.  ------------------------------------------------------------------- Pulmonary artery:   The main pulmonary artery was normal-sized. Systolic pressure was within the normal range.  ------------------------------------------------------------------- Right atrium:  The atrium was normal in size.  ------------------------------------------------------------------- Pericardium:  There was no pericardial effusion.  ------------------------------------------------------------------- Systemic veins: Inferior vena cava: The vessel was normal in size.  ------------------------------------------------------------------- Measurements   Left ventricle                           Value          Reference  LV ID, ED, PLAX chordal  44    mm       43 - 52  LV ID, ES, PLAX chordal                  33    mm       23 - 38  LV fx shortening, PLAX chordal   (L)     25    %        >=29  LV PW thickness, ED                       16    mm       ----------  IVS/LV PW ratio, ED                      1              <=1.3  Stroke volume, 2D                        99    ml       ----------  Stroke volume/bsa, 2D                    45    ml/m^2   ----------  LV e&', lateral                           9.9   cm/s     ----------  LV E/e&', lateral                         13.84          ----------  LV e&', medial                            6.96  cm/s     ----------  LV E/e&', medial                          19.68          ----------  LV e&', average                           8.43  cm/s     ----------  LV E/e&', average                         16.25          ----------    Ventricular septum                       Value          Reference  IVS thickness, ED                        16    mm       ----------    LVOT                                     Value          Reference  LVOT ID, S  23    mm       ----------  LVOT area                                4.15  cm^2     ----------  LVOT peak velocity, S                    128   cm/s     ----------  LVOT mean velocity, S                    76.6  cm/s     ----------  LVOT VTI, S                              23.9  cm       ----------  LVOT peak gradient, S                    7     mm Hg    ----------    Aortic valve                             Value          Reference  Aortic valve peak velocity, S            412   cm/s     ----------  Aortic valve mean velocity, S            236   cm/s     ----------  Aortic valve VTI, S                      79.5  cm       ----------  Aortic mean gradient, S                  30    mm Hg    ----------  Aortic peak gradient, S                  68    mm Hg    ----------  VTI ratio, LVOT/AV                       0.3            ----------  Aortic valve area, VTI                   1.25  cm^2     ----------  Aortic valve area/bsa, VTI               0.57  cm^2/m^2 ----------  Velocity ratio, peak, LVOT/AV            0.31            ----------  Aortic valve area, peak velocity         1.29  cm^2     ----------  Aortic valve area/bsa, peak              0.59  cm^2/m^2 ----------  velocity  Velocity ratio, mean, LVOT/AV            0.32           ----------  Aortic valve area, mean velocity  1.35  cm^2     ----------  Aortic valve area/bsa, mean              0.61  cm^2/m^2 ----------  velocity    Aorta                                    Value          Reference  Aortic root ID, ED                       46    mm       ----------    Left atrium                              Value          Reference  LA ID, A-P, ES                           71    mm       ----------  LA ID/bsa, A-P                   (H)     3.22  cm/m^2   <=2.2  LA volume, S                             215   ml       ----------  LA volume/bsa, S                         97.5  ml/m^2   ----------  LA volume, ES, 1-p A4C                   259   ml       ----------  LA volume/bsa, ES, 1-p A4C               117.5 ml/m^2   ----------  LA volume, ES, 1-p A2C                   177   ml       ----------  LA volume/bsa, ES, 1-p A2C               80.3  ml/m^2   ----------    Mitral valve                             Value          Reference  Mitral E-wave peak velocity              137   cm/s     ----------  Mitral mean velocity, D                  102   cm/s     ----------  Mitral deceleration time         (H)     243   ms       150 - 230  Mitral pressure half-time                71    ms       ----------  Mitral mean gradient, D                  6     mm Hg    ----------  Mitral peak gradient, D                  8     mm Hg    ----------  Mitral valve area, PHT, DP               3.1   cm^2     ----------  Mitral valve area/bsa, PHT, DP           1.41  cm^2/m^2 ----------  Mitral valve area, LVOT                  2.62  cm^2     ----------  continuity  Mitral valve area/bsa, LVOT              1.19  cm^2/m^2 ----------  continuity  Mitral annulus  VTI, D                    37.8  cm       ----------    Right atrium                             Value          Reference  RA ID, S-I, ES, A4C              (H)     73.3  mm       34 - 49  RA area, ES, A4C                 (H)     31.7  cm^2     8.3 - 19.5  RA volume, ES, A/L                       114   ml       ----------  RA volume/bsa, ES, A/L                   51.7  ml/m^2   ----------    Systemic veins                           Value          Reference  Estimated CVP                            8     mm Hg    ----------    Right ventricle                          Value          Reference  TAPSE                                    22    mm       ----------  RV s&', lateral, S                        11.4  cm/s     ----------  Legend: (L)  and  (H)  mark values outside specified reference range.  ------------------------------------------------------------------- Prepared and Electronically Authenticated by  Jenne Campus, MD 2019-08-16T17:36:44      Impression:  I think this 77 year old gentleman has stage D severe, symptomatic aortic stenosis with NYHA class II symptoms of exertional shortness of breath and fatigue with exertion. He tends to not complain and is fairly sedentary due to his orthopedic problems but his wife and daughter have noticed the progression of these symptoms and that he has no energy. I have personally reviewed his 2D echo from 09/25/2017 and his aortic valve is heavily calcified and thickened with poor leaflet mobility and looks like a severely stenotic valve. The mean gradient was measured at 42 mm Hg consistent with severe AS although the report says that the mean gradient was 30. The report also says that the mitral valve is structurally normal which it isn't with significant calcification and mild stenosis. I think AVR is warranted in this patient. I think he would be at moderate risk for open surgical AVR given his age and orthopedic problems with reduced  mobility. I think TAVR would be the best method for replacing his valve.   The patient and his family were counseled at length regarding treatment alternatives for management of severe symptomatic aortic stenosis. The risks and benefits of surgical intervention has been discussed in detail. Long-term prognosis with medical therapy was discussed. Alternative approaches such as conventional surgical aortic valve replacement, transcatheter aortic valve replacement, and palliative medical therapy were compared and contrasted at length. This discussion was placed in the context of the patient's own specific clinical presentation and past medical history. All of their questions have been addressed. The patient would like to proceed with further evaluation for TAVR.   Plan:  He will be scheduled for an evaluation by one of the structural heart cardiologists and may need a repeat cath, followed by a gated cardiac CTA and CTA of the chest, abdomen and pelvis. Then a decision can be made about the appropriateness of TAVR.   I spent 60 minutes performing this consultation and > 50% of this time was spent face to face counseling and coordinating the care of this patient's severe, symptomatic aortic stenosis.  Gaye Pollack, MD 09/30/2017
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
## 2017-10-09 ENCOUNTER — Encounter: Payer: Self-pay | Admitting: Cardiovascular Disease

## 2017-10-09 ENCOUNTER — Ambulatory Visit: Payer: Medicare HMO | Admitting: Cardiovascular Disease

## 2017-10-09 VITALS — BP 128/72 | HR 84 | Ht 70.0 in | Wt 211.4 lb

## 2017-10-09 DIAGNOSIS — I350 Nonrheumatic aortic (valve) stenosis: Secondary | ICD-10-CM | POA: Diagnosis not present

## 2017-10-09 LAB — BASIC METABOLIC PANEL
BUN/Creatinine Ratio: 25 — ABNORMAL HIGH (ref 10–24)
BUN: 19 mg/dL (ref 8–27)
CO2: 26 mmol/L (ref 20–29)
Calcium: 9.5 mg/dL (ref 8.6–10.2)
Chloride: 99 mmol/L (ref 96–106)
Creatinine, Ser: 0.75 mg/dL — ABNORMAL LOW (ref 0.76–1.27)
GFR calc Af Amer: 101 mL/min/{1.73_m2} (ref >59)
GFR calc non Af Amer: 87 mL/min/{1.73_m2} (ref >59)
Glucose: 101 mg/dL — ABNORMAL HIGH (ref 65–99)
Potassium: 4.3 mmol/L (ref 3.5–5.2)
Sodium: 140 mmol/L (ref 134–144)

## 2017-10-09 LAB — CBC
Hematocrit: 43.6 % (ref 37.5–51.0)
Hemoglobin: 13.9 g/dL (ref 13.0–17.7)
MCH: 26.9 pg (ref 26.6–33.0)
MCHC: 31.9 g/dL (ref 31.5–35.7)
MCV: 84 fL (ref 79–97)
Platelets: 160 10*3/uL (ref 150–450)
RBC: 5.17 x10E6/uL (ref 4.14–5.80)
RDW: 15.2 % (ref 12.3–15.4)
WBC: 6.6 10*3/uL (ref 3.4–10.8)

## 2017-10-09 NOTE — Patient Instructions (Addendum)
Medication Instructions:  Your physician recommends that you continue on your current medications as directed. Please refer to the Current Medication list given to you today. See instructions below  Labwork: Lab work to be done today--BMP and CBC  Testing/Procedures: Your physician has requested that you have a cardiac catheterization. Cardiac catheterization is used to diagnose and/or treat various heart conditions. Doctors may recommend this procedure for a number of different reasons. The most common reason is to evaluate chest pain. Chest pain can be a symptom of coronary artery disease (CAD), and cardiac catheterization can show whether plaque is narrowing or blocking your heart's arteries. This procedure is also used to evaluate the valves, as well as measure the blood flow and oxygen levels in different parts of your heart. For further information please visit [email or web address][email or web address]. See instructions below Scheduled for September 12,2019    Follow-Up: To be arranged after catheterization  Any Other Special Instructions Will Be Listed Below (If Applicable).      [facility] [place], [place] [address] Dept: [phone] Loc: [phone]  Eric Perry  10/09/2017  You are scheduled for a Cardiac Catheterization on Thursday, September 12 with Dr. Lauree Chandler.  1. Please arrive at the [facility] (Main Entrance A) at [facility]: [address] [place],  [address] at 8:00 AM (This time is two and a half hours before your procedure to ensure your preparation). Free valet parking service is available.   Special note: Every effort is made to have your procedure done on time. Please understand that emergencies sometimes delay scheduled procedures.  2. Diet: Do not eat solid foods after midnight.  The patient may have clear liquids until 5am upon the day  of the procedure.  3. Labs: Lab work done in office on August 30,2019  4. Medication instructions in preparation for your procedure:   Contrast Allergy: No   Stop Eliquis after evening dose on September 9,2019.     On the morning of your procedure, take your Aspirin and any morning medicines NOT listed above.  You may use sips of water.  5. Plan for one night stay--bring personal belongings. 6. Bring a current list of your medications and current insurance cards. 7. You MUST have a responsible person to drive you home. 8. Someone MUST be with you the first 24 hours after you arrive home or your discharge will be delayed. 9. Please wear clothes that are easy to get on and off and wear slip-on shoes.  Thank you for allowing [place] to care for you!   -- [facility]   If you need a refill on your cardiac medications before your next appointment, please call your pharmacy.

## 2017-10-14 ENCOUNTER — Other Ambulatory Visit: Payer: Self-pay

## 2017-10-14 DIAGNOSIS — R0609 Other forms of dyspnea: Secondary | ICD-10-CM

## 2017-10-14 DIAGNOSIS — I350 Nonrheumatic aortic (valve) stenosis: Secondary | ICD-10-CM

## 2017-10-20 ENCOUNTER — Telehealth: Payer: Self-pay | Admitting: *Deleted

## 2017-10-20 NOTE — Telephone Encounter (Addendum)
Catheterization scheduled at [facility] for: Thursday October 22, 2017 10:30 AM Verified arrival time and place: [facility] Entrance A at: 8 AM  No solid food after midnight prior to cath, clear liquids until 5 AM day of procedure. Verified no contrast allergy. Verified no diabetes medications.  Hold: Eliquis-10/20/17 until post procedure  Except hold medications AM meds can be  taken pre-cath with sip of water including: ASA 81 mg  Confirm patient has responsible person to drive home post procedure and for 24 hours after you arrive home: yes  LMTCB to discuss with patient.  I discussed instructions with patient, he verbalized understanding, thanked me for call.

## 2017-10-22 ENCOUNTER — Ambulatory Visit (HOSPITAL_COMMUNITY)
Admission: RE | Admit: 2017-10-22 | Discharge: 2017-10-22 | Disposition: A | Discharge status: Home / Self Care | Payer: Medicare HMO | Source: Ambulatory Visit | Attending: Cardiovascular Disease | Admitting: Cardiovascular Disease

## 2017-10-22 ENCOUNTER — Ambulatory Visit (HOSPITAL_COMMUNITY)
Admission: RE | Disposition: A | Discharge status: Home / Self Care | Payer: Self-pay | Source: Ambulatory Visit | Attending: Cardiovascular Disease

## 2017-10-22 DIAGNOSIS — I350 Nonrheumatic aortic (valve) stenosis: Secondary | ICD-10-CM | POA: Insufficient documentation

## 2017-10-22 DIAGNOSIS — Z87891 Personal history of nicotine dependence: Secondary | ICD-10-CM | POA: Insufficient documentation

## 2017-10-22 DIAGNOSIS — Z9889 Other specified postprocedural states: Secondary | ICD-10-CM | POA: Diagnosis not present

## 2017-10-22 DIAGNOSIS — Z96653 Presence of artificial knee joint, bilateral: Secondary | ICD-10-CM | POA: Insufficient documentation

## 2017-10-22 DIAGNOSIS — Z79899 Other long term (current) drug therapy: Secondary | ICD-10-CM | POA: Diagnosis not present

## 2017-10-22 DIAGNOSIS — Z800 Family history of malignant neoplasm of digestive organs: Secondary | ICD-10-CM | POA: Diagnosis not present

## 2017-10-22 DIAGNOSIS — Z881 Allergy status to other antibiotic agents status: Secondary | ICD-10-CM | POA: Diagnosis not present

## 2017-10-22 DIAGNOSIS — I2510 Atherosclerotic heart disease of native coronary artery without angina pectoris: Secondary | ICD-10-CM | POA: Insufficient documentation

## 2017-10-22 DIAGNOSIS — Z952 Presence of prosthetic heart valve: Secondary | ICD-10-CM | POA: Diagnosis not present

## 2017-10-22 DIAGNOSIS — Z7982 Long term (current) use of aspirin: Secondary | ICD-10-CM | POA: Diagnosis not present

## 2017-10-22 DIAGNOSIS — Z8546 Personal history of malignant neoplasm of prostate: Secondary | ICD-10-CM | POA: Diagnosis not present

## 2017-10-22 DIAGNOSIS — M1990 Unspecified osteoarthritis, unspecified site: Secondary | ICD-10-CM | POA: Insufficient documentation

## 2017-10-22 DIAGNOSIS — Z7901 Long term (current) use of anticoagulants: Secondary | ICD-10-CM | POA: Insufficient documentation

## 2017-10-22 DIAGNOSIS — E785 Hyperlipidemia, unspecified: Secondary | ICD-10-CM | POA: Diagnosis not present

## 2017-10-22 DIAGNOSIS — I4891 Unspecified atrial fibrillation: Secondary | ICD-10-CM | POA: Diagnosis not present

## 2017-10-22 DIAGNOSIS — Z888 Allergy status to other drugs, medicaments and biological substances status: Secondary | ICD-10-CM | POA: Insufficient documentation

## 2017-10-22 HISTORY — PX: RIGHT/LEFT HEART CATH AND CORONARY ANGIOGRAPHY: CATH118266

## 2017-10-22 LAB — POCT I-STAT 3, ART BLOOD GAS (G3+)
Acid-Base Excess: 3 mmol/L — ABNORMAL HIGH (ref 0.0–2.0)
BICARBONATE: 27.3 mmol/L (ref 20.0–28.0)
O2 Saturation: 98 %
PCO2 ART: 38.9 mmHg (ref 32.0–48.0)
PH ART: 7.454 — AB (ref 7.350–7.450)
TCO2: 28 mmol/L (ref 22–32)
pO2, Arterial: 102 mmHg (ref 83.0–108.0)

## 2017-10-22 LAB — POCT I-STAT 3, VENOUS BLOOD GAS (G3P V)
ACID-BASE EXCESS: 1 mmol/L (ref 0.0–2.0)
BICARBONATE: 26 mmol/L (ref 20.0–28.0)
O2 SAT: 80 %
TCO2: 27 mmol/L (ref 22–32)
pCO2, Ven: 39.6 mmHg — ABNORMAL LOW (ref 44.0–60.0)
pH, Ven: 7.425 (ref 7.250–7.430)
pO2, Ven: 43 mmHg (ref 32.0–45.0)

## 2017-10-22 SURGERY — RIGHT/LEFT HEART CATH AND CORONARY ANGIOGRAPHY
Anesthesia: LOCAL

## 2017-10-22 MED ORDER — SODIUM CHLORIDE 0.9 % IV SOLN
INTRAVENOUS | Status: AC
  Administered 2017-10-22: 09:00:00 via INTRAVENOUS

## 2017-10-22 MED ORDER — SODIUM CHLORIDE 0.9% FLUSH: 3.0000 mL | Freq: Two times a day (BID) | INTRAVENOUS | Status: DC

## 2017-10-22 MED ORDER — SODIUM CHLORIDE 0.9% FLUSH: 3.0000 mL | INTRAVENOUS | Status: DC | PRN

## 2017-10-22 MED ORDER — FENTANYL CITRATE (PF) 100 MCG/2ML IJ SOLN
INTRAMUSCULAR | Status: DC | PRN
  Administered 2017-10-22: 50 ug via INTRAVENOUS

## 2017-10-22 MED ORDER — FENTANYL CITRATE (PF) 100 MCG/2ML IJ SOLN
INTRAMUSCULAR | Status: AC
  Filled 2017-10-22: qty 2

## 2017-10-22 MED ORDER — ACETAMINOPHEN 325 MG PO TABS: 650.0000 mg | ORAL_TABLET | ORAL | Status: DC | PRN

## 2017-10-22 MED ORDER — MIDAZOLAM HCL 2 MG/2ML IJ SOLN
INTRAMUSCULAR | Status: AC
  Filled 2017-10-22: qty 2

## 2017-10-22 MED ORDER — HEPARIN (PORCINE) IN NACL 1000-0.9 UT/500ML-% IV SOLN
INTRAVENOUS | Status: AC
  Filled 2017-10-22: qty 1000

## 2017-10-22 MED ORDER — HEPARIN (PORCINE) IN NACL 1000-0.9 UT/500ML-% IV SOLN
INTRAVENOUS | Status: DC | PRN
  Administered 2017-10-22 (×2): 500 mL

## 2017-10-22 MED ORDER — SODIUM CHLORIDE 0.9 % IV SOLN: 250.0000 mL | INTRAVENOUS | Status: DC | PRN

## 2017-10-22 MED ORDER — VERAPAMIL HCL 2.5 MG/ML IV SOLN
INTRAVENOUS | Status: DC | PRN
  Administered 2017-10-22: 10 mL via INTRA_ARTERIAL

## 2017-10-22 MED ORDER — SODIUM CHLORIDE 0.9 % IV SOLN: INTRAVENOUS | Status: DC

## 2017-10-22 MED ORDER — LIDOCAINE HCL (PF) 1 % IJ SOLN
INTRAMUSCULAR | Status: AC
  Filled 2017-10-22: qty 30

## 2017-10-22 MED ORDER — ONDANSETRON HCL 4 MG/2ML IJ SOLN: 4.0000 mg | Freq: Four times a day (QID) | INTRAMUSCULAR | Status: DC | PRN

## 2017-10-22 MED ORDER — VERAPAMIL HCL 2.5 MG/ML IV SOLN
INTRAVENOUS | Status: AC
  Filled 2017-10-22: qty 2

## 2017-10-22 MED ORDER — ASPIRIN 81 MG PO CHEW: 81.0000 mg | CHEWABLE_TABLET | ORAL | Status: DC

## 2017-10-22 MED ORDER — IOHEXOL 350 MG/ML SOLN
INTRAVENOUS | Status: DC | PRN
  Administered 2017-10-22: 85 mL via INTRACARDIAC

## 2017-10-22 MED ORDER — HEPARIN SODIUM (PORCINE) 1000 UNIT/ML IJ SOLN
INTRAMUSCULAR | Status: AC
  Filled 2017-10-22: qty 1

## 2017-10-22 MED ORDER — LIDOCAINE HCL (PF) 1 % IJ SOLN
INTRAMUSCULAR | Status: DC | PRN
  Administered 2017-10-22: 15 mL
  Administered 2017-10-22 (×2): 2 mL

## 2017-10-22 SURGICAL SUPPLY — 17 items
CATH 5FR JL3.5 JR4 ANG PIG MP (CATHETERS) ×1 IMPLANT
CATH BALLN WEDGE 5F 110CM (CATHETERS) ×1 IMPLANT
CATH INFINITI 5FR AL1 (CATHETERS) ×1 IMPLANT
DEVICE RAD COMP TR BAND LRG (VASCULAR PRODUCTS) ×1 IMPLANT
GLIDESHEATH SLEND SS 6F .021 (SHEATH) ×1 IMPLANT
GUIDEWIRE INQWIRE 1.5J.035X260 (WIRE) IMPLANT
INQWIRE 1.5J .035X260CM (WIRE) ×2
KIT HEART LEFT (KITS) ×2 IMPLANT
KIT MICROPUNCTURE NIT STIFF (SHEATH) ×1 IMPLANT
PACK CARDIAC CATHETERIZATION (CUSTOM PROCEDURE TRAY) ×2 IMPLANT
SHEATH GLIDE SLENDER 4/5FR (SHEATH) ×1 IMPLANT
SHEATH PINNACLE 5F 10CM (SHEATH) ×1 IMPLANT
SHEATH PROBE COVER 6X72 (BAG) ×1 IMPLANT
TRANSDUCER W/STOPCOCK (MISCELLANEOUS) ×2 IMPLANT
TUBING CIL FLEX 10 FLL-RA (TUBING) ×2 IMPLANT
WIRE EMERALD ST .035X150CM (WIRE) ×1 IMPLANT
WIRE HI TORQ VERSACORE-J 145CM (WIRE) ×1 IMPLANT

## 2017-10-22 NOTE — Interval H&P Note (Signed)
History and Physical Interval Note:  10/22/2017 11:05 AM  [name]  has presented today for cardiac cath with the diagnosis of severe aortic stenosis. The various methods of treatment have been discussed with the patient and family. After consideration of risks, benefits and other options for treatment, the patient has consented to  Procedure(s): RIGHT/LEFT HEART CATH AND CORONARY ANGIOGRAPHY (N/A) as a surgical intervention .  The patient's history has been reviewed, patient examined, no change in status, stable for surgery.  I have reviewed the patient's chart and labs.  Questions were answered to the patient's satisfaction.    Cath Lab Visit (complete for each Cath Lab visit)  Clinical Evaluation Leading to the Procedure:   ACS: No.  Non-ACS:    Anginal Classification: CCS II  Anti-ischemic medical therapy: No Therapy  Non-Invasive Test Results: No non-invasive testing performed  Prior CABG: No previous CABG         Lauree Chandler

## 2017-10-22 NOTE — Discharge Instructions (Signed)
*Resume Eliquis on Saturday 10/24/17 if no bleeding from groin or wrist access.*    DRINK PLENTY OF FLUIDS FOR THE NEXT 2-3 DAYS TO KEEP HYDRATED.  Radial Site Care Refer to this sheet in the next few weeks. These instructions provide you with information about caring for yourself after your procedure. Your health care provider may also give you more specific instructions. Your treatment has been planned according to current medical practices, but problems sometimes occur. Call your health care provider if you have any problems or questions after your procedure. What can I expect after the procedure? After your procedure, it is typical to have the following:  Bruising at the radial site that usually fades within 1-2 weeks.  Blood collecting in the tissue (hematoma) that may be painful to the touch. It should usually decrease in size and tenderness within 1-2 weeks.  Follow these instructions at home:  Take medicines only as directed by your health care provider.  You may shower 24-48 hours after the procedure or as directed by your health care provider. Remove the bandage (dressing) and gently wash the site with plain soap and water. Pat the area dry with a clean towel. Do not rub the site, because this may cause bleeding.  Do not take baths, swim, or use a hot tub until your health care provider approves.  Check your insertion site every day for redness, swelling, or drainage.  Do not apply powder or lotion to the site.  Do not flex or bend the affected arm for 24 hours or as directed by your health care provider.  Do not push or pull heavy objects with the affected arm for 24 hours or as directed by your health care provider.  Do not lift over 10 lb (4.5 kg) for 5 days after your procedure or as directed by your health care provider.  Ask your health care provider when it is okay to: ? Return to work or school. ? Resume usual physical activities or sports. ? Resume sexual  activity.  Do not drive home if you are discharged the same day as the procedure. Have someone else drive you.  You may drive 24 hours after the procedure unless otherwise instructed by your health care provider.  Do not operate machinery or power tools for 24 hours after the procedure.  If your procedure was done as an outpatient procedure, which means that you went home the same day as your procedure, a responsible adult should be with you for the first 24 hours after you arrive home.  Keep all follow-up visits as directed by your health care provider. This is important. Contact a health care provider if:  You have a fever.  You have chills.  You have increased bleeding from the radial site. Hold pressure on the site. Get help right away if:  You have unusual pain at the radial site.  You have redness, warmth, or swelling at the radial site.  You have drainage (other than a small amount of blood on the dressing) from the radial site.  The radial site is bleeding, and the bleeding does not stop after 30 minutes of holding steady pressure on the site.  Your arm or hand becomes pale, cool, tingly, or numb. This information is not intended to replace advice given to you by your health care provider. Make sure you discuss any questions you have with your health care provider. Document Released: 03/01/2010 Document Revised: 07/05/2015 Document Reviewed: 08/15/2013 Elsevier Interactive Patient Education  2018 [employer].   Femoral Site Care Refer to this sheet in the next few weeks. These instructions provide you with information about caring for yourself after your procedure. Your health care provider may also give you more specific instructions. Your treatment has been planned according to current medical practices, but problems sometimes occur. Call your health care provider if you have any problems or questions after your procedure. What can I expect after the procedure? After  your procedure, it is typical to have the following:  Bruising at the site that usually fades within 1-2 weeks.  Blood collecting in the tissue (hematoma) that may be painful to the touch. It should usually decrease in size and tenderness within 1-2 weeks.  Follow these instructions at home:  Take medicines only as directed by your health care provider.  You may shower 24-48 hours after the procedure or as directed by your health care provider. Remove the bandage (dressing) and gently wash the site with plain soap and water. Pat the area dry with a clean towel. Do not rub the site, because this may cause bleeding.  Do not take baths, swim, or use a hot tub until your health care provider approves.  Check your insertion site every day for redness, swelling, or drainage.  Do not apply powder or lotion to the site.  Limit use of stairs to twice a day for the first 2-3 days or as directed by your health care provider.  Do not squat for the first 2-3 days or as directed by your health care provider.  Do not lift over 10 lb (4.5 kg) for 5 days after your procedure or as directed by your health care provider.  Ask your health care provider when it is okay to: ? Return to work or school. ? Resume usual physical activities or sports. ? Resume sexual activity.  Do not drive home if you are discharged the same day as the procedure. Have someone else drive you.  You may drive 24 hours after the procedure unless otherwise instructed by your health care provider.  Do not operate machinery or power tools for 24 hours after the procedure or as directed by your health care provider.  If your procedure was done as an outpatient procedure, which means that you went home the same day as your procedure, a responsible adult should be with you for the first 24 hours after you arrive home.  Keep all follow-up visits as directed by your health care provider. This is important. Contact a health care  provider if:  You have a fever.  You have chills.  You have increased bleeding from the site. Hold pressure on the site. Get help right away if:  You have unusual pain at the site.  You have redness, warmth, or swelling at the site.  You have drainage (other than a small amount of blood on the dressing) from the site.  The site is bleeding, and the bleeding does not stop after 30 minutes of holding steady pressure on the site.  Your leg or foot becomes pale, cool, tingly, or numb. This information is not intended to replace advice given to you by your health care provider. Make sure you discuss any questions you have with your health care provider. Document Released: 09/30/2013 Document Revised: 07/05/2015 Document Reviewed: 08/16/2013 Elsevier Interactive Patient Education  [employer].

## 2017-10-22 NOTE — Progress Notes (Signed)
Site Area: Right Femoral Artery Site Prior to Removal: Level 0 Pressure Applied For: 15 minutes Manual: YES Patient Status During Pull: stable Post Pull Site: Level 0 Post Pull Instructions Given: YES Post Pull Pulses Present: YES Dressing Applied: Sterile gauze and tegaderm Bedrest begins @ 8569 Comments:  Removed by Ricki Rodriguez, RT [facility]

## 2017-10-23 ENCOUNTER — Other Ambulatory Visit: Payer: Self-pay

## 2017-10-23 ENCOUNTER — Encounter (HOSPITAL_COMMUNITY): Payer: Self-pay | Admitting: Cardiovascular Disease

## 2017-10-23 MED ORDER — METOPROLOL TARTRATE 50 MG PO TABS: ORAL_TABLET | ORAL | 0 refills | Status: DC

## 2017-10-23 MED FILL — Heparin Sodium (Porcine) Inj 1000 Unit/ML: INTRAMUSCULAR | Qty: 10 | Status: AC

## 2017-10-23 MED FILL — Midazolam HCl Inj 2 MG/2ML (Base Equivalent): INTRAMUSCULAR | Qty: 2 | Status: AC

## 2017-11-09 ENCOUNTER — Ambulatory Visit: Payer: Medicare HMO | Attending: Cardiovascular Disease | Admitting: Physical Therapy

## 2017-11-09 ENCOUNTER — Ambulatory Visit (HOSPITAL_COMMUNITY)
Admission: RE | Admit: 2017-11-09 | Discharge: 2017-11-09 | Disposition: A | Discharge status: Home / Self Care | Payer: Medicare HMO | Source: Ambulatory Visit | Attending: Cardiovascular Disease | Admitting: Cardiovascular Disease

## 2017-11-09 ENCOUNTER — Ambulatory Visit (HOSPITAL_COMMUNITY): Payer: Medicare HMO

## 2017-11-09 ENCOUNTER — Ambulatory Visit (HOSPITAL_BASED_OUTPATIENT_CLINIC_OR_DEPARTMENT_OTHER)
Admission: RE | Admit: 2017-11-09 | Discharge: 2017-11-09 | Disposition: A | Discharge status: Home / Self Care | Payer: Medicare HMO | Source: Ambulatory Visit | Attending: Cardiovascular Disease | Admitting: Cardiovascular Disease

## 2017-11-09 ENCOUNTER — Encounter: Payer: Self-pay | Admitting: Physical Therapy

## 2017-11-09 ENCOUNTER — Encounter (HOSPITAL_COMMUNITY): Payer: Self-pay

## 2017-11-09 ENCOUNTER — Other Ambulatory Visit: Payer: Self-pay

## 2017-11-09 DIAGNOSIS — N323 Diverticulum of bladder: Secondary | ICD-10-CM | POA: Insufficient documentation

## 2017-11-09 DIAGNOSIS — I2510 Atherosclerotic heart disease of native coronary artery without angina pectoris: Secondary | ICD-10-CM | POA: Insufficient documentation

## 2017-11-09 DIAGNOSIS — R2689 Other abnormalities of gait and mobility: Secondary | ICD-10-CM | POA: Diagnosis not present

## 2017-11-09 DIAGNOSIS — I350 Nonrheumatic aortic (valve) stenosis: Secondary | ICD-10-CM | POA: Diagnosis not present

## 2017-11-09 DIAGNOSIS — R293 Abnormal posture: Secondary | ICD-10-CM | POA: Insufficient documentation

## 2017-11-09 DIAGNOSIS — M6281 Muscle weakness (generalized): Secondary | ICD-10-CM | POA: Diagnosis not present

## 2017-11-09 DIAGNOSIS — I714 Abdominal aortic aneurysm, without rupture: Secondary | ICD-10-CM | POA: Diagnosis not present

## 2017-11-09 DIAGNOSIS — R0609 Other forms of dyspnea: Secondary | ICD-10-CM | POA: Diagnosis not present

## 2017-11-09 DIAGNOSIS — N401 Enlarged prostate with lower urinary tract symptoms: Secondary | ICD-10-CM | POA: Diagnosis not present

## 2017-11-09 DIAGNOSIS — N138 Other obstructive and reflux uropathy: Secondary | ICD-10-CM | POA: Insufficient documentation

## 2017-11-09 DIAGNOSIS — J989 Respiratory disorder, unspecified: Secondary | ICD-10-CM | POA: Insufficient documentation

## 2017-11-09 DIAGNOSIS — I771 Stricture of artery: Secondary | ICD-10-CM | POA: Insufficient documentation

## 2017-11-09 DIAGNOSIS — I712 Thoracic aortic aneurysm, without rupture: Secondary | ICD-10-CM | POA: Diagnosis not present

## 2017-11-09 DIAGNOSIS — I700 Atherosclerosis of aorta: Secondary | ICD-10-CM | POA: Diagnosis not present

## 2017-11-09 DIAGNOSIS — I6523 Occlusion and stenosis of bilateral carotid arteries: Secondary | ICD-10-CM | POA: Insufficient documentation

## 2017-11-09 LAB — PULMONARY FUNCTION TEST
DL/VA % PRED: 88 %
DL/VA: 4.07 ml/min/mmHg/L
DLCO unc % pred: 71 %
DLCO unc: 23.28 ml/min/mmHg
FEF 25-75 POST: 3.09 L/s
FEF 25-75 Pre: 2.74 L/sec
FEF2575-%CHANGE-POST: 12 %
FEF2575-%PRED-POST: 153 %
FEF2575-%PRED-PRE: 136 %
FEV1-%CHANGE-POST: 3 %
FEV1-%Pred-Post: 97 %
FEV1-%Pred-Pre: 94 %
FEV1-PRE: 2.73 L
FEV1-Post: 2.83 L
FEV1FVC-%CHANGE-POST: 1 %
FEV1FVC-%PRED-PRE: 109 %
FEV6-%Change-Post: 1 %
FEV6-%Pred-Post: 92 %
FEV6-%Pred-Pre: 90 %
FEV6-Post: 3.51 L
FEV6-Pre: 3.45 L
FEV6FVC-%Change-Post: 0 %
FEV6FVC-%PRED-POST: 106 %
FEV6FVC-%Pred-Pre: 106 %
FVC-%Change-Post: 1 %
FVC-%PRED-POST: 87 %
FVC-%PRED-PRE: 85 %
FVC-POST: 3.54 L
FVC-Pre: 3.48 L
PRE FEV1/FVC RATIO: 79 %
Post FEV1/FVC ratio: 80 %
Post FEV6/FVC ratio: 99 %
Pre FEV6/FVC Ratio: 99 %
RV % pred: 119 %
RV: 3.17 L
TLC % PRED: 95 %
TLC: 6.73 L

## 2017-11-09 MED ORDER — IOPAMIDOL (ISOVUE-370) INJECTION 76%
INTRAVENOUS | Status: AC
  Administered 2017-11-09: 100 mL
  Filled 2017-11-09: qty 100

## 2017-11-09 MED ORDER — METOPROLOL TARTRATE 5 MG/5ML IV SOLN
2.5000 mg | Freq: Once | INTRAVENOUS | Status: AC
  Administered 2017-11-09: 2.5 mg via INTRAVENOUS

## 2017-11-09 MED ORDER — METOPROLOL TARTRATE 5 MG/5ML IV SOLN
INTRAVENOUS | Status: AC
  Filled 2017-11-09: qty 5

## 2017-11-09 MED ORDER — ALBUTEROL SULFATE (2.5 MG/3ML) 0.083% IN NEBU
2.5000 mg | INHALATION_SOLUTION | Freq: Once | RESPIRATORY_TRACT | Status: AC
  Administered 2017-11-09: 2.5 mg via RESPIRATORY_TRACT

## 2017-11-09 NOTE — Therapy (Signed)
[place], [place], [address] Phone: [phone]   Fax:  [phone]  Physical Therapy Evaluation  Patient Details  Name: Eric Perry MRN: [id number] Date of Birth: [birth year] Referring Provider (PT): Dr. Lauree Chandler   Encounter Date: 11/09/2017  PT End of Session - 11/09/17 1054    Visit Number  1    PT Start Time  4481    PT Stop Time  1049    PT Time Calculation (min)  34 min    Equipment Utilized During Treatment  Gait belt       Past Medical History:  Diagnosis Date  . Aortic stenosis 08/07/2017  . Atrial fibrillation ([place]) 08/07/2017  . CAD (coronary artery disease) 08/07/2017  . Hyperlipidemia   . Osteoarthritis 08/07/2017  . Prostate cancer [facility])     Past Surgical History:  Procedure Laterality Date  . APPENDECTOMY    . CARDIAC CATHETERIZATION    . REPLACEMENT TOTAL KNEE BILATERAL    . RIGHT/LEFT HEART CATH AND CORONARY ANGIOGRAPHY N/A 10/22/2017   Procedure: RIGHT/LEFT HEART CATH AND CORONARY ANGIOGRAPHY;  Surgeon: Burnell Blanks, MD;  Location: [place] [facility];  Service: Cardiovascular;  Laterality: N/A;  . SHOULDER SURGERY Left 2018    There were no vitals filed for this visit.   Subjective Assessment - 11/09/17 1018    Subjective  reports dyspnea in AM and walking up hills, started about 6 months ago    Patient Stated Goals  to fix heart    Currently in Pain?  Yes    Pain Score  6    back, neck and legs   Aggravating Factors   walking    Pain Relieving Factors  meds         [facility] PT Assessment - 11/09/17 0001      Assessment   Medical Diagnosis  severe aortic stenosis    Referring Provider (PT)  Dr. Lauree Chandler    Onset Date/Surgical Date  05/09/17   approximate     Precautions   Precautions  Fall      Restrictions   Weight Bearing Restrictions  No      Balance Screen   Has the patient fallen in the past 6 months  Yes    How many  times?  2   loss of balance   Has the patient had a decrease in activity level because of a fear of falling?   No    Is the patient reluctant to leave their home because of a fear of falling?   No      Home Environment   Living Environment  Private residence    Living Arrangements  Spouse/significant other    Type of [place]  One level    Home Equipment  Wheelchair - manual;Wheelchair - power    Additional Comments  uses WC out in community      Prior Function   Level of Independence  Independent with community mobility with device      Posture/Postural Control   Posture/Postural Control  Postural limitations    Postural Limitations  Rounded Shoulders;Forward head      ROM / Strength   AROM / PROM / Strength  AROM;Strength      AROM   Overall AROM Comments  groslly WNL      Strength   Overall Strength Comments  grossly 3/3 with  MMT due to pain in lower extremities    Strength Assessment Site  Hand    Right/Left hand  Right;Left    Right Hand Grip (lbs)  60   R hand dominant   Left Hand Grip (lbs)  50       [facility] Pre-Surgical Assessment - 11/09/17 0001    5 Meter Walk Test- trial 1  9 sec    5 Meter Walk Test- trial 2  9 sec.     5 Meter Walk Test- trial 3  10 sec.    5 meter walk test average  9.33 sec    4 Stage Balance Test tolerated for:   10 sec.    4 Stage Balance Test Position  1    comment  needs assist to assume the position    Comment  unable without UE support    ADL/IADL Independent with:  Bathing;Dressing    ADL/IADL Needs Assistance with:  [place] [employer] work    ADL/IADL Fraility Index  Midly frail    6 Minute Walk- Baseline  yes    BP (mmHg)  156/75    HR (bpm)  69    02 Sat (%RA)  98 %    Modified Borg Scale for Dyspnea  0- Nothing at all    Perceived Rate of Exertion (Borg)  6-    6 Minute Walk Post Test  yes    BP (mmHg)  162/90    HR (bpm)  83    02 Sat (%RA)  97 %    Modified Borg Scale for Dyspnea  0.5-  Very, very slight shortness of breath    Perceived Rate of Exertion (Borg)  9- very light    Aerobic Endurance Distance Walked  625              Objective measurements completed on examination: See above findings.              PT Education - 11/09/17 1053    Education Details  fall risk, benefits of RW vs cane    Person(s) Educated  Patient    Methods  Explanation    Comprehension  Verbalized understanding                  Plan - 11/09/17 1054    Clinical Impression Statement  see below    PT Frequency  One time visit    Consulted and Agree with Plan of Care  Patient      Clinical Impression Statement: Pt is a 77 yo male presenting to OP PT for evaluation prior to possible TAVR surgery due to severe aortic stenosis. Pt reports onset of shortness of breath in the AM and with walking up inclines and stairs approximately 6 months ago. Pt presents with good ROM and fair strength, poor balance and is at high fall risk 4 stage balance test, slow walking speed and poor aerobic endurance per 6 minute walk test. Pt ambulated a total of 625 feet in 6 minute walk. Based on the Short Physical Performance Battery, patient has a frailty rating of 2/12 with </= 5/12 considered frail.   Patient demonstrated the following deficits and impairments:     Visit Diagnosis: Other abnormalities of gait and mobility  Abnormal posture  Muscle weakness (generalized)     Problem List Patient Active Problem List   Diagnosis Date Noted  . Severe aortic stenosis   . Osteoarthritis 08/07/2017  . Aortic stenosis 08/07/2017  . CAD (coronary artery disease)  08/07/2017  . Atrial fibrillation ([place]) 08/07/2017    [place], PT 11/09/2017, 10:54 AM  [facility] [address] [place], [place], [address] Phone: [phone]   Fax:  [phone]  Name: Eric Perry MRN: [id number] Date of Birth: 02-26-1940

## 2017-11-09 NOTE — Progress Notes (Signed)
Carotid artery duplex has been completed. 1-39% ICA stenosis bilaterally.  11/09/17 1:21 PM Eric Perry RVT

## 2017-11-09 NOTE — Progress Notes (Signed)
CT scan completed. Tolerated well. D/C home with family, awake and alert in no distress.

## 2017-11-11 ENCOUNTER — Encounter: Payer: Self-pay | Admitting: Surgery

## 2017-11-11 ENCOUNTER — Other Ambulatory Visit: Payer: Self-pay

## 2017-11-11 ENCOUNTER — Ambulatory Visit (INDEPENDENT_AMBULATORY_CARE_PROVIDER_SITE_OTHER): Payer: Medicare HMO | Admitting: Surgery

## 2017-11-11 ENCOUNTER — Ambulatory Visit (HOSPITAL_COMMUNITY)
Admission: RE | Admit: 2017-11-11 | Discharge: 2017-11-11 | Disposition: A | Discharge status: Home / Self Care | Payer: Medicare HMO | Source: Ambulatory Visit | Attending: Surgery | Admitting: Surgery

## 2017-11-11 VITALS — BP 128/76 | HR 68 | Resp 16 | Ht 70.0 in | Wt 213.0 lb

## 2017-11-11 DIAGNOSIS — I770 Arteriovenous fistula, acquired: Secondary | ICD-10-CM

## 2017-11-11 DIAGNOSIS — I350 Nonrheumatic aortic (valve) stenosis: Secondary | ICD-10-CM | POA: Diagnosis not present

## 2017-11-11 NOTE — Progress Notes (Signed)
HPI:  The patient returns today to discuss the results of his recent cardiac catheterization and CTA studies.  He feels about the same with class II exertional fatigue and shortness of breath.  Current Outpatient Medications  Medication Sig Dispense Refill  . apixaban (ELIQUIS) 5 MG TABS tablet Take 5 mg by mouth 2 (two) times daily.    Marland Kitchen aspirin 81 MG tablet Take 81 mg by mouth at bedtime.     . BUFFERIN EXTRA STRENGTH 500 MG TABS Take 2-3 tablets by mouth 3 (three) times daily as needed (for pain.).    Marland Kitchen lansoprazole (PREVACID) 15 MG capsule Take 15 mg by mouth daily before breakfast.     . metoprolol tartrate (LOPRESSOR) 50 MG tablet Take one tablet by mouth one hour prior to TAVR CT scans 1 tablet 0  . morphine (MSIR) 15 MG tablet Take 15 mg by mouth 3 (three) times daily as needed for severe pain (for pain.).     Marland Kitchen Omega-3 Fatty Acids (FISH OIL) 1000 MG CAPS Take 1,000 mg by mouth 2 (two) times daily.     Vladimir Faster Glycol-Propyl Glycol (LUBRICANT EYE DROPS) 0.4-0.3 % SOLN Place 1 drop into both eyes 3 (three) times daily as needed (for pain.).    Marland Kitchen simvastatin (ZOCOR) 40 MG tablet Take 40 mg by mouth at bedtime.     . tamsulosin (FLOMAX) 0.4 MG CAPS capsule Take 0.4 mg by mouth daily after supper.     No current facility-administered medications for this visit.      Physical Exam: BP 128/76 (BP Location: Right Arm, Patient Position: Sitting, Cuff Size: Large)   Pulse 68   Resp 16   Ht 5\' 10"  (1.778 m)   Wt 213 lb (96.6 kg)   SpO2 97% Comment: ON RA  BMI 30.56 kg/m  He looks well. Cardiac exam shows a regular rate and rhythm with a 3/6 systolic murmur along the right sternal border. Lungs are clear. There is mild right groin ecchymosis but no hematoma. There is no peripheral edema.  Diagnostic Tests:   Physicians   Panel Physicians Referring Physician Case Authorizing Physician  Burnell Blanks, MD (Primary)    Procedures   RIGHT/LEFT HEART CATH AND  CORONARY ANGIOGRAPHY  Conclusion     Prox RCA to Mid RCA lesion is 100% stenosed.  Ost 1st Mrg to 1st Mrg lesion is 40% stenosed.  Prox Cx to Mid Cx lesion is 20% stenosed.  Prox LAD lesion is 10% stenosed.  Mid LAD lesion is 40% stenosed.  Hemodynamic findings consistent with mild pulmonary hypertension.   1. Patent stent mid LAD with minimal restenosis. Mild non-obstructive disease noted in the distal LAD 2. The circumflex artery has mild disease in the mid AV groove segment. The obtuse marginal branch has mild to moderate non-obstructive disease. 3. The RCA is a moderate caliber non-dominant vessel with 100% chronic occlusion in the proximal vessel. The mid and distal vessel fills from right to right collaterals and left to right collaterals.  4. Severe aortic stenosis. Cath data with mean gradient of 20.3 mmHg, peak to peak gradient 26 mmHg). By echo, the leaflets do not open well and there appears to be severe aortic stenosis. Mean gradient 42 mmHg by echo (upon image review, not reported in detailed final summary) and AVA 0.84 cm2.   Recommendations: Continue medical management of CAD. Will continue workup for TAVR    Indications   Severe aortic stenosis [I35.0 (ICD-10-CM)]  Procedural Details/Technique  Technical Details Indication: 77 yo male with history of CAD and severe aortic stenosis. Workup for TAVR.   Procedure: The risks, benefits, complications, treatment options, and expected outcomes were discussed with the patient. The patient and/or family concurred with the proposed plan, giving informed consent. The patient was brought to the cath lab after IV hydration was given. The patient was sedated with Versed and Fentanyl. The IV catheter present in the right antecubital vein was prepped and draped. I changed this out for a 5 [place] sheath. Right heart catheterization performed with a balloon tipped catheter. The right wrist was prepped and draped in a sterile  fashion. 1% lidocaine was used for local anesthesia. Using the modified Seldinger access technique, a 5 French sheath was placed in the right radial artery. 3 mg Verapamil was given through the sheath. I was unable to pass the wire and catheter into the aortic root from the radial approach. The right groin was prepped and draped. A 5 French sheath was placed in the right femoral artery using u/s guidance. Images printed, saved and stored. Standard diagnostic catheters were used to perform selective coronary angiography. I crossed the aortic valve with an AL-1 catheter and a straight wire. The sheath was removed from the right radial artery and a Terumo hemostasis band was applied at the arteriotomy site on the right wrist.      Estimated blood loss <50 mL.  During this procedure the patient was administered the following to achieve and maintain moderate conscious sedation: Fentanyl 50 mcg, while the patient's heart rate, blood pressure, and oxygen saturation were continuously monitored. The period of conscious sedation was 40 minutes, of which I was present face-to-face 100% of this time.  Complications   Complications documented before study signed (10/22/2017 1:17 PM EDT)    RIGHT/LEFT HEART CATH AND CORONARY ANGIOGRAPHY   None Documented by Burnell Blanks, MD 10/22/2017 12:27 PM EDT  Time Range: Intraprocedure      Coronary Findings   Diagnostic  Dominance: Left  Left Anterior Descending  Vessel is large.  Prox LAD lesion 10% stenosed  Prox LAD lesion is 10% stenosed. The lesion was previously treated using a stent (unknown type) over 2 years ago.  Mid LAD lesion 40% stenosed  Mid LAD lesion is 40% stenosed.  Left Circumflex  Prox Cx to Mid Cx lesion 20% stenosed  Prox Cx to Mid Cx lesion is 20% stenosed.  First Obtuse Marginal Branch  Ost 1st Mrg to 1st Mrg lesion 40% stenosed  Ost 1st Mrg to 1st Mrg lesion is 40% stenosed.  Right Coronary Artery  Vessel is moderate in  size.  Collaterals  Dist RCA filled by collaterals from Dist Cx.    Prox RCA to Mid RCA lesion 100% stenosed  Prox RCA to Mid RCA lesion is 100% stenosed. The lesion is chronically occluded.  Acute Marginal Branch  Collaterals  Acute Mrg filled by collaterals from Prox RCA.    Intervention   No interventions have been documented.  Right Heart   Right Heart Pressures Hemodynamic findings consistent with mild pulmonary hypertension.  Coronary Diagrams   Diagnostic Diagram       Implants    No implant documentation for this case.  MERGE Images   Show images for CARDIAC CATHETERIZATION   Link to Procedure Log   Procedure Log    Hemo Data    Most Recent Value  Fick Cardiac Output 8.39 L/min  Fick Cardiac Output Index 3.91 (L/min)/BSA  Aortic Mean  Gradient 20.26 mmHg  Aortic Peak Gradient 26 mmHg  Aortic Valve Area 2.81  Aortic Value Area Index 1.31 cm2/BSA  RA A Wave 8 mmHg  RA V Wave 11 mmHg  RA Mean 7 mmHg  RV Systolic Pressure 36 mmHg  RV Diastolic Pressure 2 mmHg  RV EDP 7 mmHg  PA Systolic Pressure 40 mmHg  PA Diastolic Pressure 15 mmHg  PA Mean 25 mmHg  PW A Wave 21 mmHg  PW V Wave 29 mmHg  PW Mean 18 mmHg  AO Systolic Pressure 811 mmHg  AO Diastolic Pressure 72 mmHg  AO Mean 96 mmHg  LV Systolic Pressure 914 mmHg  LV Diastolic Pressure 3 mmHg  LV EDP 15 mmHg  AOp Systolic Pressure 782 mmHg  AOp Diastolic Pressure 75 mmHg  AOp Mean Pressure 956 mmHg  LVp Systolic Pressure 213 mmHg  LVp Diastolic Pressure 2 mmHg  LVp EDP Pressure 15 mmHg  QP/QS 1  TPVR Index 6.4 HRUI  TSVR Index 24.57 HRUI  PVR SVR Ratio 0.08  TPVR/TSVR Ratio 0.26     ADDENDUM REPORT: 11/09/2017 16:45  EXAM: OVER-READ INTERPRETATION  CT CHEST  The following report is an over-read performed by radiologist Dr. Samara Snide [facility] on 11/09/2017. This over-read does not include interpretation of cardiac or coronary anatomy or pathology. The coronary CTA  interpretation by the cardiologist is attached.  COMPARISON:  12/03/2013 chest radiograph.  FINDINGS: Please see the separate concurrent chest CT angiogram report for details.  IMPRESSION: Please see the separate concurrent chest CT angiogram report for details.   Electronically Signed   By: Ilona Sorrel M.D.   On: 11/09/2017 16:45   Addended by Sharyn Blitz, MD on 11/09/2017 4:47 PM    Study Result   CLINICAL DATA:  Aortic Stenosis  EXAM: Cardiac TAVR CT  TECHNIQUE: The patient was scanned on a Siemens Force 086 slice scanner. A 120 kV retrospective scan was triggered in the ascending thoracic aorta at 140 HU's. Gantry rotation speed was 250 msecs and collimation was .6 mm. No beta blockade or nitro were given. The 3D data set was reconstructed in 5% intervals of the R-R cycle. Systolic and diastolic phases were analyzed on a dedicated work station using MPR, MIP and VRT modes. The patient received 80 cc of contrast.  FINDINGS: Aortic Valve: Tri leaflet, calcified with restricted leaflet motion  Aorta: Moderate calcific atherosclerotic debris with bovine arch  Sino-tubular Junction: 32 mm  Ascending Thoracic Aorta: 41 mm  Aortic Arch: 34 mm  Descending Thoracic Aorta: 36 mm  Sinus of Valsalva Measurements:  Non-coronary: 39 mm  Right - coronary: 39 mm  Left -   coronary: 39 mm  Coronary Artery Height above Annulus:  Left Main: 12.6 mm above annulus  Right Coronary: 21 mm above annulus  Virtual Basal Annulus Measurements:  Maximum / Minimum Diameter: 32.4 mm x 25.9 mm  Perimeter: 98.4 mm  Area: 707 mm2  Coronary Arteries: Sufficient height above annulus for deployment  Optimum Fluoroscopic Angle for Delivery: RAO 7 Cranial 7  IMPRESSION: 1. Calcified tri leaflet AV with annular area of 707 mm2 and perimeter 98.4 mm Upper limit of normal for a 29 Sapien 3 is 683 mm2 and perimeter for a 34 mm Evolut R is  94.2 mm  2.  Coronary arteries sufficient height above annulus for deployment  3.  No LAA thrombus  4.  Mld aortic root enlargement 4.1 cm  5. Optimum angiographic angle for deployment RAO 7  degrees Cranial 7 degrees  Will need to discuss with team if patient is suitable for TAVR  Jenkins Rouge  Electronically Signed: By: Jenkins Rouge M.D. On: 11/09/2017 15:49       CLINICAL DATA:  Severe symptomatic aortic stenosis. Dyspnea on exertion. TAVR evaluation.  EXAM: CT ANGIOGRAPHY CHEST, ABDOMEN AND PELVIS  TECHNIQUE: Multidetector CT imaging through the chest, abdomen and pelvis was performed using the standard protocol during bolus administration of intravenous contrast. Multiplanar reconstructed images and MIPs were obtained and reviewed to evaluate the vascular anatomy.  CONTRAST:  14mL ISOVUE-370 IOPAMIDOL (ISOVUE-370) INJECTION 76%  COMPARISON:  07/30/2005 abdominal sonogram. 12/03/2013 chest radiograph.  FINDINGS: CTA CHEST FINDINGS  Cardiovascular: Mild-to-moderate cardiomegaly. Thickened and coarsely calcified aortic valve. No significant pericardial effusion/thickening. Left main and 3 vessel coronary atherosclerosis. Atherosclerotic thoracic aorta with ectatic 4.3 cm ascending thoracic aorta. Borderline prominent main pulmonary artery (3.3 cm diameter). No central pulmonary emboli.  Mediastinum/Nodes: No discrete thyroid nodules. Unremarkable esophagus. No axillary adenopathy. Mildly enlarged 1.3 cm left supraclavicular node (series 14/image 14). Right paratracheal adenopathy up to 1.4 cm (series 14/image 33). Mildly enlarged 1.3 cm subcarinal node (series 14/image 51). Mild bilateral hilar adenopathy up to 1.4 cm on the right (series 14/image 39) and 1.2 cm on the left (series 14/image 50).  Lungs/Pleura: No pneumothorax. No pleural effusion. Subsolid 7 mm posterior right upper lobe pulmonary nodule (series 15/image 25). No acute  consolidative airspace disease, lung masses or additional significant pulmonary nodules.  Musculoskeletal: No aggressive appearing focal osseous lesions. Soft tissue anchors are noted in the left humeral head. Moderate thoracic spondylosis.  CTA ABDOMEN AND PELVIS FINDINGS  Hepatobiliary: Normal liver with no liver mass. Normal gallbladder with no radiopaque cholelithiasis. No biliary ductal dilatation.  Pancreas: Normal, with no mass or duct dilation.  Spleen: Normal size. No mass.  Adrenals/Urinary Tract: Normal adrenals. No hydronephrosis. Simple 3.9 cm posterior upper right renal cyst. Subcentimeter hypodense renal cortical lesion in the interpolar left kidney is too small to characterize and requires no follow-up. Diffuse bladder wall thickening and trabeculation with numerous scattered bladder diverticula, largest 3.8 cm in the posterior right bladder wall.  Stomach/Bowel: Normal non-distended stomach. Normal caliber small bowel with no small bowel wall thickening. Appendectomy. Normal large bowel with no diverticulosis, large bowel wall thickening or pericolonic fat stranding.  Vascular/Lymphatic: Atherosclerotic abdominal aorta with 3.7 cm infrarenal abdominal aortic aneurysm. Patent portal, splenic, hepatic and renal veins. Asymmetric arterial phase enhancement of the right common femoral and right iliac veins (series 14/image 192), cannot exclude right common femoral artery venous fistula. No pathologically enlarged lymph nodes in the abdomen or pelvis.  Reproductive: Mildly enlarged prostate with brachytherapy seeds surrounding the prostate.  Other: No pneumoperitoneum, ascites or focal fluid collection.  Musculoskeletal: No aggressive appearing focal osseous lesions. Endplate based mixed lytic and sclerotic lesions throughout the lumbar vertebrae, favor degenerative Schmorl's nodes. Moderate lumbar spondylosis.  VASCULAR MEASUREMENTS PERTINENT TO  TAVR:  AORTA:  Minimal Aortic Diameter-21.2 x 21.2 mm (abdominal aorta on series 14/image 114)  Severity of Aortic Calcification-moderate, noting mild-to-moderate atherosclerotic plaque in ascending thoracic aorta at the sinotubular junction.  RIGHT PELVIS:  Right Common Iliac Artery -  Minimal Diameter-12.9 x 12.6 mm  Tortuosity-mild  Calcification-moderate  Right External Iliac Artery -  Minimal Diameter-10.9 x 9.8 mm  Tortuosity-moderate  Calcification-none  Right Common Femoral Artery -  Minimal Diameter-10.4 x 10.0 mm  Tortuosity-mild  Calcification-none  LEFT PELVIS:  Left Common Iliac Artery -  Minimal Diameter-14.1  x 13.6 mm  Tortuosity-moderate  Calcification-moderate  Left External Iliac Artery -  Minimal Diameter-9.7 x 9.1 mm  Tortuosity-mild  Calcification-none  Left Common Femoral Artery -  Minimal Diameter-10.1 x 9.8 mm  Tortuosity-mild  Calcification-none  Review of the MIP images confirms the above findings.  IMPRESSION: 1. Vascular findings and measurements pertinent to potential TAVR procedure, as detailed above. 2. Severe thickening and calcification of the aortic valve, compatible with the reported clinical history of severe symptomatic aortic stenosis. 3. Aortic Atherosclerosis (ICD10-I70.0). 4. 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 5. Infrarenal 3.7 cm Abdominal Aortic Aneurysm (ICD10-I71.9). Recommend follow-up aortic ultrasound in 2 years. This recommendation follows ACR consensus guidelines: White Paper of the ACR Incidental Findings Committee II on Vascular Findings. J Am Coll Radiol 2013; 10:789-794. 6. 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 Recommend annual imaging followup by CTA or MRA. This recommendation follows 2010 ACCF/AHA/AATS/ACR/ASA/SCA/SCAI/SIR/STS/SVM  Guidelines for the Diagnosis and Management of Patients with Thoracic Aortic Disease. Circulation. 2010; 121: R443-X540. 7. Mild-to-moderate cardiomegaly. Left main and 3 vessel coronary atherosclerosis. 8. Nonspecific mild mediastinal, bilateral hilar and left supraclavicular lymphadenopathy. These nodes can be reassessed on follow-up chest CT with IV contrast in 3-6 months. 9. Subsolid 7 mm posterior right upper lobe pulmonary nodule. Follow-up non-contrast CT recommended at 3-6 months to confirm persistence. If unchanged, and solid component remains <6 mm, annual CT is recommended until 5 years of stability has been established. If persistent these nodules should be considered highly suspicious if the solid component of the nodule is 6 mm or greater in size and enlarging. This recommendation follows the consensus statement: Guidelines for Management of Incidental Pulmonary Nodules Detected on CT Images: From the Fleischner Society 2017; Radiology 2017; 284:228-243. 10. 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   Electronically Signed   By: Ilona Sorrel M.D.   On: 11/09/2017 16:44    Impression:  I have personally reviewed the patient's cardiac catheterization and CTA studies.  His cardiac catheterization shows mild nonobstructive disease in the distal LAD.  The mid LAD is stent with minimal restenosis.  The left circumflex marginal branch has mild to moderate nonobstructive disease.  The right coronary artery is a nondominant vessel with 100% chronic occlusion proximally.  The distal vessel fills by collaterals.  The mean gradient by cardiac catheterization was 20.3 with a peak to peak gradient of 26 mmHg.  By echo the gradient was 42 mmHg and the valve appears severely calcified and stenosed.  His gated cardiac CTA shows an annular dimension of 707 mm.  They  report from [place] measured 676 mm.  I think this is probably suitable for a 29 mm Sapien 3 valve.  His abdominal and pelvic CTA shows adequate pelvic vasculature for transfemoral insertion.  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  This will require ultrasound for confirmation.  His CT scans also had some other findings including a 7 mm sub-solid nodule in the posterior aspect of the right upper lobe that will require follow-up with CT scan in 3 to 6 months.  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  This will also require continued follow-up with a CT scan in about 1 year.  He also has  a 3.7 cm infrarenal abdominal aortic aneurysm that will require further follow-up.  I reviewed the CTA images with the patient and his wife and daughter and answered all their questions.  The patient and his family were counseled at length regarding treatment alternatives for management of severe symptomatic aortic stenosis. The risks and benefits of surgical intervention has been discussed in detail. Long-term prognosis with medical therapy was discussed. Alternative approaches such as conventional surgical aortic valve replacement, transcatheter aortic valve replacement, and palliative medical therapy were compared and contrasted at length. This discussion was placed in the context of the patient's own specific clinical presentation and past medical history. All of their questions have been addressed.   Following the decision to proceed with transcatheter aortic valve replacement, a discussion was held regarding what types of management strategies would be attempted intraoperatively in the event of life-threatening complications, including whether or not the patient would be considered a candidate for the use of cardiopulmonary bypass and/or conversion to open sternotomy for attempted  surgical intervention.  The patient has been advised of a variety of complications that might develop including but not limited to risks of death, stroke, paravalvular leak, aortic dissection or other major vascular complications, aortic annulus rupture, device embolization, cardiac rupture or perforation, mitral regurgitation, acute myocardial infarction, arrhythmia, heart block or bradycardia requiring permanent pacemaker placement, congestive heart failure, respiratory failure, renal failure, pneumonia, infection, other late complications related to structural valve deterioration or migration, or other complications that might ultimately cause a temporary or permanent loss of functional independence or other long term morbidity. The patient provides full informed consent for the procedure as described and all questions were answered.    Plan:  He will be scheduled for an ultrasound of the right groin to rule out right femoral arteriovenous fistula.  He will tentatively be scheduled for left transfemoral transcatheter aortic valve replacement on 11/17/2017.   I spent 15 minutes performing this established patient evaluation and > 50% of this time was spent face to face counseling and coordinating the care of this patient's severe aortic stenosis.    Gaye Pollack, MD [facility] [phone]

## 2017-11-11 NOTE — Progress Notes (Signed)
Right limited arterial duplex has been completed. 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 Results were given to [facility] at Dr. Vivi Martens office.  11/11/17 11:39 AM Eric Perry RVT

## 2017-11-12 ENCOUNTER — Other Ambulatory Visit: Payer: Self-pay

## 2017-11-12 DIAGNOSIS — I350 Nonrheumatic aortic (valve) stenosis: Secondary | ICD-10-CM

## 2017-11-12 NOTE — Pre-Procedure Instructions (Signed)
Eric Perry  11/12/2017      [id number] - [place], [address] AT [place] [address] [place] [address] Phone: [phone] Fax: [phone]    Your procedure is scheduled on October 8th.  Report to [facility] at 0800 A.M.  Call this number if you have problems the morning of surgery:  [phone]   Remember:  Do not eat or drink after midnight.   Do Not take any medications the morning of surgery.  Stop taking your Eliquis on 10/2.  Follow your surgeons instructions for when to stop taking your aspirin.  7 days prior to surgery STOP taking any Aspirin(unless otherwise instructed by your surgeon), Aleve, Naproxen, Ibuprofen, Motrin, Advil, Goody's, BC's, all herbal medications, fish oil, and all vitamins   Do not wear jewelry.  Do not wear lotions, powders, or colognes, or deodorant.  Do not shave 48 hours prior to surgery.  Men may shave face and neck.  Do not bring valuables to the hospital.  [facility] is not responsible for any belongings or valuables.  Contacts, dentures or bridgework may not be worn into surgery.  Leave your suitcase in the car.  After surgery it may be brought to your room.  For patients admitted to the hospital, discharge time will be determined by your treatment team.  Patients discharged the day of surgery will not be allowed to drive home.    [facility]- Preparing For Surgery  Before surgery, you can play an important role. Because skin is not sterile, your skin needs to be as free of germs as possible. You can reduce the number of germs on your skin by washing with CHG (chlorahexidine gluconate) Soap before surgery.  CHG is an antiseptic cleaner which kills germs and bonds with the skin to continue killing germs even after washing.    Oral Hygiene is also important to reduce your risk of infection.  Remember - BRUSH YOUR TEETH THE MORNING OF SURGERY  WITH YOUR REGULAR TOOTHPASTE  Please do not use if you have an allergy to CHG or antibacterial soaps. If your skin becomes reddened/irritated stop using the CHG.  Do not shave (including legs and underarms) for at least 48 hours prior to first CHG shower. It is OK to shave your face.  Please follow these instructions carefully.   1. Shower the NIGHT BEFORE SURGERY and the MORNING OF SURGERY with CHG.   2. If you chose to wash your hair, wash your hair first as usual with your normal shampoo.  3. After you shampoo, rinse your hair and body thoroughly to remove the shampoo.  4. Use CHG as you would any other liquid soap. You can apply CHG directly to the skin and wash gently with a scrungie or a clean washcloth.   5. Apply the CHG Soap to your body ONLY FROM THE NECK DOWN.  Do not use on open wounds or open sores. Avoid contact with your eyes, ears, mouth and genitals (private parts). Wash Face and genitals (private parts)  with your normal soap.  6. Wash thoroughly, paying special attention to the area where your surgery will be performed.  7. Thoroughly rinse your body with warm water from the neck down.  8. DO NOT shower/wash with your normal soap after using and rinsing off the CHG Soap.  9. Pat yourself dry with a CLEAN TOWEL.  10. Wear CLEAN  PAJAMAS to bed the night before surgery, wear comfortable clothes the morning of surgery  11. Place CLEAN SHEETS on your bed the night of your first shower and DO NOT SLEEP WITH PETS.    Day of Surgery:  Do not apply any deodorants/lotions.  Please wear clean clothes to the hospital/surgery center.   Remember to brush your teeth WITH YOUR REGULAR TOOTHPASTE.    Please read over the following fact sheets that you were given.

## 2017-11-13 ENCOUNTER — Encounter (HOSPITAL_COMMUNITY): Payer: Self-pay

## 2017-11-13 ENCOUNTER — Other Ambulatory Visit: Payer: Self-pay

## 2017-11-13 ENCOUNTER — Ambulatory Visit (HOSPITAL_COMMUNITY)
Admission: RE | Admit: 2017-11-13 | Discharge: 2017-11-13 | Disposition: A | Discharge status: Home / Self Care | Payer: Medicare HMO | Source: Ambulatory Visit | Attending: Cardiovascular Disease | Admitting: Cardiovascular Disease

## 2017-11-13 ENCOUNTER — Encounter (HOSPITAL_COMMUNITY)
Admission: RE | Admit: 2017-11-13 | Discharge: 2017-11-13 | Disposition: A | Discharge status: Home / Self Care | Payer: Medicare HMO | Source: Ambulatory Visit | Attending: Cardiovascular Disease | Admitting: Cardiovascular Disease

## 2017-11-13 DIAGNOSIS — Z01818 Encounter for other preprocedural examination: Secondary | ICD-10-CM | POA: Diagnosis not present

## 2017-11-13 DIAGNOSIS — I517 Cardiomegaly: Secondary | ICD-10-CM | POA: Insufficient documentation

## 2017-11-13 DIAGNOSIS — I350 Nonrheumatic aortic (valve) stenosis: Secondary | ICD-10-CM

## 2017-11-13 LAB — APTT: aPTT: 32 seconds (ref 24–36)

## 2017-11-13 LAB — URINALYSIS, ROUTINE W REFLEX MICROSCOPIC
BILIRUBIN URINE: NEGATIVE
GLUCOSE, UA: NEGATIVE mg/dL
HGB URINE DIPSTICK: NEGATIVE
KETONES UR: NEGATIVE mg/dL
Leukocytes, UA: NEGATIVE
Nitrite: NEGATIVE
PROTEIN: NEGATIVE mg/dL
Specific Gravity, Urine: 1.015 (ref 1.005–1.030)
pH: 6 (ref 5.0–8.0)

## 2017-11-13 LAB — CBC
HEMATOCRIT: 45.4 % (ref 39.0–52.0)
HEMOGLOBIN: 14.4 g/dL (ref 13.0–17.0)
MCH: 27.4 pg (ref 26.0–34.0)
MCHC: 31.7 g/dL (ref 30.0–36.0)
MCV: 86.5 fL (ref 78.0–100.0)
Platelets: 141 10*3/uL — ABNORMAL LOW (ref 150–400)
RBC: 5.25 MIL/uL (ref 4.22–5.81)
RDW: 14.8 % (ref 11.5–15.5)
WBC: 5.9 10*3/uL (ref 4.0–10.5)

## 2017-11-13 LAB — COMPREHENSIVE METABOLIC PANEL
ALBUMIN: 3.6 g/dL (ref 3.5–5.0)
ALT: 13 U/L (ref 0–44)
ANION GAP: 9 (ref 5–15)
AST: 19 U/L (ref 15–41)
Alkaline Phosphatase: 64 U/L (ref 38–126)
BUN: 13 mg/dL (ref 8–23)
CHLORIDE: 103 mmol/L (ref 98–111)
CO2: 25 mmol/L (ref 22–32)
Calcium: 9.4 mg/dL (ref 8.9–10.3)
Creatinine, Ser: 0.64 mg/dL (ref 0.61–1.24)
GFR calc non Af Amer: >60 mL/min (ref >60)
GLUCOSE: 190 mg/dL — AB (ref 70–99)
POTASSIUM: 3.9 mmol/L (ref 3.5–5.1)
Sodium: 137 mmol/L (ref 135–145)
Total Bilirubin: 1 mg/dL (ref 0.3–1.2)
Total Protein: 6.8 g/dL (ref 6.5–8.1)

## 2017-11-13 LAB — BLOOD GAS, ARTERIAL
ACID-BASE EXCESS: 4.6 mmol/L — AB (ref 0.0–2.0)
BICARBONATE: 28.6 mmol/L — AB (ref 20.0–28.0)
DRAWN BY: 421801
FIO2: 21
O2 Saturation: 97.7 %
PCO2 ART: 42.5 mmHg (ref 32.0–48.0)
PH ART: 7.442 (ref 7.350–7.450)
Patient temperature: 98.6
pO2, Arterial: 97.4 mmHg (ref 83.0–108.0)

## 2017-11-13 LAB — BRAIN NATRIURETIC PEPTIDE: B NATRIURETIC PEPTIDE 5: 206.3 pg/mL — AB (ref 0.0–100.0)

## 2017-11-13 LAB — PROTIME-INR
INR: 1.04
Prothrombin Time: 13.5 seconds (ref 11.4–15.2)

## 2017-11-13 LAB — TYPE AND SCREEN
ABO/RH(D): AB POS
ANTIBODY SCREEN: NEGATIVE

## 2017-11-13 LAB — SURGICAL PCR SCREEN
MRSA, PCR: NEGATIVE
Staphylococcus aureus: POSITIVE — AB

## 2017-11-13 LAB — ABO/RH: ABO/RH(D): AB POS

## 2017-11-13 NOTE — Anesthesia Preprocedure Evaluation (Addendum)
Anesthesia Evaluation  Patient identified by MRN, date of birth, ID band Patient awake    Reviewed: Allergy & Precautions, NPO status , Patient's Chart, lab work & pertinent test results, reviewed documented beta blocker date and time   History of Anesthesia Complications Negative for: history of anesthetic complications  Airway Mallampati: II  TM Distance: >3 FB Neck ROM: Full    Dental  (+) Edentulous Upper, Edentulous Lower   Pulmonary former smoker,    breath sounds clear to auscultation       Cardiovascular hypertension, Pt. on medications and Pt. on home beta blockers (-) angina+ CAD (LAD stent, 100% RCA/collaterals), + Cardiac Stents (LAD) and + Peripheral Vascular Disease (mild enlargement of the ascending aorta at 4.3 cm)  + dysrhythmias Atrial Fibrillation + Valvular Problems/Murmurs AS  Rhythm:Irregular Rate:Normal  8/19 ECHO: EF 55-60%, Aortic valve: Valve mobility was restricted. Severe AS, mild AI. Valve area (VTI): 1.25 cm^2, Mean gradient (S): 30 mm Hg. Peak gradient (S): 68 mm Hg.  mild MR   Neuro/Psych negative neurological ROS     GI/Hepatic negative GI ROS, Neg liver ROS,   Endo/Other  negative endocrine ROS  Renal/GU negative Renal ROS   Prostate cancer    Musculoskeletal  (+) Arthritis , Osteoarthritis,    Abdominal   Peds  Hematology eliquis   Anesthesia Other Findings   Reproductive/Obstetrics                           Anesthesia Physical Anesthesia Plan  ASA: III  Anesthesia Plan: MAC   Post-op Pain Management:    Induction:   PONV Risk Score and Plan: 1 and Ondansetron and Treatment may vary due to age or medical condition  Airway Management Planned: Natural Airway and Simple Face Mask  Additional Equipment: Arterial line, CVP and Ultrasound Guidance Line Placement  Intra-op Plan:   Post-operative Plan:   Informed Consent: I have reviewed the  patients History and Physical, chart, labs and discussed the procedure including the risks, benefits and alternatives for the proposed anesthesia with the patient or authorized representative who has indicated his/her understanding and acceptance.   Dental advisory given  Plan Discussed with:   Anesthesia Plan Comments: (Per Theodosia Quay, RN: Patient developed right groin AVF post cath, so TAVR team requests the following sites for line placement: left radial for pigtail, left femoral vein for pacing wire and left femoral artery for delivery sheath. Myra Gianotti, PA-C Plan routine monitors, A-line, central line, MAC)      Anesthesia Quick Evaluation

## 2017-11-13 NOTE — Progress Notes (Signed)
Surgical PCR +staph. Mupirocin called into [facility] [phone]. Left message for patient to pick up medication, begin as soon as possible, and to bring with them on day of surgery.

## 2017-11-13 NOTE — Progress Notes (Signed)
PCP - Dr. Greig Right Cardiologist - Dr. Lorre Nick   Chest x-ray - 11/13/17 EKG - 11/13/17 Stress Test - denies ECHO - 04/2017 Cardiac Cath - 10/2017 PFT's-10/2017  Sleep Study - denies  Blood Thinner Instructions: Stop plavix on 11/11/17 Aspirin Instructions: Continue plavix up until DOS  Anesthesia review: Yes, hx of CAD.   Patient denies shortness of breath, fever, cough and chest pain at PAT appointment   Patient verbalized understanding of instructions that were given to them at the PAT appointment. Patient was also instructed that they will need to review over the PAT instructions again at home before surgery.

## 2017-11-14 LAB — HEMOGLOBIN A1C
HEMOGLOBIN A1C: 5.8 % — AB (ref 4.8–5.6)
Mean Plasma Glucose: 120 mg/dL

## 2017-11-16 MED ORDER — NOREPINEPHRINE 4 MG/250ML-% IV SOLN
0.0000 ug/min | INTRAVENOUS | Status: DC
  Filled 2017-11-16: qty 250

## 2017-11-16 MED ORDER — SODIUM CHLORIDE 0.9 % IV SOLN
INTRAVENOUS | Status: DC
  Filled 2017-11-16: qty 1

## 2017-11-16 MED ORDER — LEVOFLOXACIN IN D5W 500 MG/100ML IV SOLN
500.0000 mg | INTRAVENOUS | Status: AC
  Administered 2017-11-17: 500 mg via INTRAVENOUS
  Filled 2017-11-16: qty 100

## 2017-11-16 MED ORDER — SODIUM CHLORIDE 0.9 % IV SOLN
INTRAVENOUS | Status: DC
  Filled 2017-11-16: qty 30

## 2017-11-16 MED ORDER — EPINEPHRINE PF 1 MG/ML IJ SOLN
0.0000 ug/min | INTRAVENOUS | Status: DC
  Filled 2017-11-16: qty 4

## 2017-11-16 MED ORDER — PHENYLEPHRINE HCL-NACL 20-0.9 MG/250ML-% IV SOLN
30.0000 ug/min | INTRAVENOUS | Status: DC
  Filled 2017-11-16: qty 250

## 2017-11-16 MED ORDER — DOPAMINE-DEXTROSE 3.2-5 MG/ML-% IV SOLN
0.0000 ug/kg/min | INTRAVENOUS | Status: DC
  Filled 2017-11-16: qty 250

## 2017-11-16 MED ORDER — POTASSIUM CHLORIDE 2 MEQ/ML IV SOLN
80.0000 meq | INTRAVENOUS | Status: DC
  Filled 2017-11-16: qty 40

## 2017-11-16 MED ORDER — MAGNESIUM SULFATE 50 % IJ SOLN
40.0000 meq | INTRAMUSCULAR | Status: DC
  Filled 2017-11-16: qty 9.85

## 2017-11-16 MED ORDER — DEXMEDETOMIDINE HCL IN NACL 400 MCG/100ML IV SOLN
0.1000 ug/kg/h | INTRAVENOUS | Status: AC
  Administered 2017-11-17: 1 ug/kg/h via INTRAVENOUS
  Filled 2017-11-16: qty 100

## 2017-11-16 MED ORDER — NITROGLYCERIN IN D5W 200-5 MCG/ML-% IV SOLN
2.0000 ug/min | INTRAVENOUS | Status: DC
  Filled 2017-11-16: qty 250

## 2017-11-16 MED ORDER — VANCOMYCIN HCL 10 G IV SOLR
1500.0000 mg | INTRAVENOUS | Status: AC
  Administered 2017-11-17: 1500 mg via INTRAVENOUS
  Filled 2017-11-16: qty 1500

## 2017-11-16 NOTE — H&P (Signed)
[address]       [place] [address]             [phone]      Cardiothoracic Surgery Admission History and Physical   Referring Provider is Revankar, Reita Cliche, MD  PCP is Greig Right, MD      Chief Complaint  Patient presents with  . Aortic Stenosis       HPI:   The patient is a 77 year old gentleman with a history of HTN, hyperlipidemia, DM on no meds, chronic atrial fibrillation on Eliquis, aortic stenosis and coronary artery disease who has been followed at the [place] [facility] [place]. He reportedly had a coronary stent in 2001. He had an echo in October 2018 showing severe AS with a mean gradient of 47 mm Hg and an AVA of 0.9 cm2 with mild AI. He had a cath in November 2018 that by the report showed a totally occluded RCA with collaterals from the left and right and a 40% OM1. He says that he was asymptomatic so the plan was for close observation. He was seen by cardiology at the [facility] in April 2019 and according to their note was asymptomatic and the plan was for continued follow up but he doesn't think another echo was done. He was sent to Dr. Geraldo Pitter in July by his PCP. An echo on 09/25/2017 showed a trileaflet aortic valve with severely thickened and moderately calcified leaflets with a mean gradient of 38-42 mm Hg and an AVA of 0.84-0.91. DI was 0.31. There was mild AI. There was mild MR with a severely dilated LA. The mitral valve was calcified with a mean gradient of 5 mm Hg and a peak of 15 mm Hg. LVEF was 55-60% with moderate LVH. He has been referred for consideration of AVR.  The patient is fairly sedentary due to orthopedic problems with bilateral knee replacements, left shoulder surgery and a back injury. He does not complain of any shortness of breath or fatigue but his wife and daughter report that he does get short of breath with activity and is more fatigued than he was a year ago, naps a lot. He denies any chest pain or dizziness.      Past Medical  History:  Diagnosis Date  . Aortic stenosis 08/07/2017  . Atrial fibrillation ([place]) 08/07/2017  . CAD (coronary artery disease) 08/07/2017  . Osteoarthritis 08/07/2017        Past Surgical History:  Procedure Laterality Date  . CARDIAC CATHETERIZATION    . REPLACEMENT TOTAL KNEE BILATERAL    . SHOULDER SURGERY Left 2018        Family History  Problem Relation Age of Onset  . CAD Neg Hx   . Sudden Cardiac Death Neg Hx    Social History        Socioeconomic History  . Marital status: Married    Spouse name: Not on file  . Number of children: Not on file  . Years of education: Not on file  . Highest education level: Not on file  Occupational History  . Not on file  Social Needs  . Financial resource strain: Not on file  . Food insecurity:    Worry: Not on file    Inability: Not on file  . Transportation needs:    Medical: Not on file    Non-medical: Not on file  Tobacco Use  . Smoking status: Former Smoker    Last attempt  to quit: 1999    Years since quitting: 20.6  . Smokeless tobacco: Never Used  Substance and Sexual Activity  . Alcohol use: Yes    Comment: occasional  . Drug use: Not Currently  . Sexual activity: Not on file  Lifestyle  . Physical activity:    Days per week: Not on file    Minutes per session: Not on file  . Stress: Not on file  Relationships  . Social connections:    Talks on phone: Not on file    Gets together: Not on file    Attends religious service: Not on file    Active member of club or organization: Not on file    Attends meetings of clubs or organizations: Not on file    Relationship status: Not on file  . Intimate partner violence:    Fear of current or ex partner: Not on file    Emotionally abused: Not on file    Physically abused: Not on file    Forced sexual activity: Not on file  Other Topics Concern  . Not on file  Social History Narrative  . Not on file         Current Outpatient Medications  Medication Sig  Dispense Refill  . apixaban (ELIQUIS) 5 MG TABS tablet Take 5 mg by mouth 2 (two) times daily.    Marland Kitchen aspirin 81 MG tablet Take by mouth daily.    . lansoprazole (PREVACID) 15 MG capsule Take 15 mg by mouth daily at 12 noon.    Marland Kitchen morphine (MSIR) 15 MG tablet Take 15 mg by mouth every 4 (four) hours as needed for severe pain.    . Omega-3 Fatty Acids (FISH OIL) 1000 MG CAPS Take 1 capsule by mouth 2 (two) times daily.    . simvastatin (ZOCOR) 40 MG tablet Take 40 mg by mouth daily.    . tamsulosin (FLOMAX) 0.4 MG CAPS capsule Take 0.4 mg by mouth daily after supper.     No current facility-administered medications for this visit.        Allergies  Allergen Reactions  . Rocephin [Ceftriaxone Sodium In Dextrose]    Review of Systems:  General: normal appetite, decreased energy, no weight gain, no weight loss, no fever  Cardiac: no chest pain with exertion, no chest pain at rest, + SOB with exertion, no resting SOB, no PND, no orthopnea, no palpitations, + arrhythmia, + atrial fibrillation, + LE edema, no dizzy spells, no syncope  Respiratory: exertional shortness of breath, no home oxygen, no productive cough, no dry cough, no bronchitis, no wheezing, no hemoptysis, no asthma, no pain with inspiration or cough, no sleep apnea, no CPAP at night  GI: no difficulty swallowing, no reflux, no frequent heartburn, + hiatal hernia, no abdominal pain, no constipation, no diarrhea, no hematochezia, no hematemesis, no melena  GU: no dysuria, + frequency, + urinary tract infection, no hematuria, no enlarged prostate, no kidney stones, no kidney disease  Vascular: no pain suggestive of claudication, + pain in feet, no leg cramps, no varicose veins, no DVT, no non-healing foot ulcer  Neuro: no stroke, no TIA's, no seizures, no headaches, no temporary blindness one eye, no slurred speech, + peripheral neuropathy, + chronic pain, + instability of gait, no memory/cognitive dysfunction  Musculoskeletal: +  arthritis, + joint swelling, + myalgias, + difficulty walking, reduced mobility  Skin: no rash, no itching, no skin infections, no pressure sores or ulcerations  Psych: no anxiety, no depression, no nervousness,  no unusual recent stress  Eyes: no blurry vision, + floaters, no recent vision changes, + wears glasses  ENT: + hearing loss, no loose or painful teeth, + dentures, last saw dentist 1 year ago  Hematologic: + easy bruising, no abnormal bleeding, no clotting disorder, no frequent epistaxis  Endocrine: no diabetes, does not check CBG's at home    Physical Exam:   BP 132/80 (BP Location: Left Arm, Patient Position: Sitting, Cuff Size: Normal)  Pulse (!) 54  Resp 20  Ht 5\' 10"  (1.778 m)  Wt 210 lb 12.8 oz (95.6 kg)  SpO2 95% Comment: RA  BMI 30.25 kg/m  General: Elderly somewhat frail-appearing  HEENT: Unremarkable, NCAT, PERLA, EOMI, oropharynx clear  Neck: no JVD, no bruits, no adenopathy or thyromegaly  Chest: clear to auscultation, symmetrical breath sounds, no wheezes, no rhonchi  CV: RRR, grade III/VI crescendo/decrescendo murmur heard best at RSB, no diastolic murmur  Abdomen: soft, non-tender, no masses or organomegaly  Extremities: warm, well-perfused, pulses diminished but palpable, trace LE edema  Rectal/GU Deferred  Neuro: Grossly non-focal and symmetrical throughout  Skin: Clean and dry, no rashes, no breakdown    Diagnostic Tests:   Result status: Final result  *[facility] [place], [place] [address]  [phone]  -------------------------------------------------------------------  Transthoracic Echocardiography  Patient: Perry, Eric Colee  MR #: [id number]  Study Date: 09/25/2017  Gender: M  Age: 89  Height: 177.8 cm  Weight: 96.2 kg  BSA: 2.2 m^2  Pt. Status:  Room:  ATTENDING Jyl Heinz, MD  ORDERING Jyl Heinz, MD  REFERRING Jyl Heinz, MD  SONOGRAPHER Joanie Coddington, RDCS  PERFORMING [facility] [place]    cc:  -------------------------------------------------------------------  LV EF: 55% - 60%  -------------------------------------------------------------------  Indications: Aortic stenosis 424.1.  -------------------------------------------------------------------  History: PMH: Atrial fibrillation. Coronary artery disease.  -------------------------------------------------------------------  Study Conclusions  - Left ventricle: The cavity size was normal. Wall thickness was  increased in a pattern of moderate LVH. Systolic function was  normal. The estimated ejection fraction was in the range of 55%  to 60%. Wall motion was normal; there were no regional wall  motion abnormalities. The study is not technically sufficient to  allow evaluation of LV diastolic function.  - Aortic valve: Valve mobility was restricted. There was severe  stenosis. There was mild regurgitation. Valve area (VTI): 1.25  cm^2. Valve area (Vmax): 1.29 cm^2. Valve area (Vmean): 1.35  cm^2.  - Mitral valve: There was mild regurgitation. Valve area by  continuity equation (using LVOT flow): 2.62 cm^2.  - Left atrium: The atrium was severely dilated.  Impressions:  - Severe AS.  Normal LVEF.  Severe LAE.  -------------------------------------------------------------------  Study data: No prior study was available for comparison. Study  status: Routine. Procedure: The patient reported no pain pre or  post test. Transthoracic echocardiography. Image quality was  adequate. Study completion: There were no complications.  Transthoracic echocardiography. M-mode, complete 2D, spectral  Doppler, and color Doppler. Birthdate: Patient birthdate:  Mar 09, 1940. Age: Patient is 77 yr old. Sex: Gender: male.  BMI: 30.4 kg/m^2. Blood pressure: 152/76 Patient status:  Outpatient. Study date: Study date: 09/25/2017. Study time: 03:08  PM. Location: Echo laboratory.   -------------------------------------------------------------------  -------------------------------------------------------------------  Left ventricle: The cavity size was normal. Wall thickness was  increased in a pattern of moderate LVH. Systolic function was  normal. The estimated ejection fraction was in the range of 55% to  60%. Wall motion was normal; there were no  regional wall motion  abnormalities. The study is not technically sufficient to allow  evaluation of LV diastolic function.  -------------------------------------------------------------------  Aortic valve: Trileaflet; severely thickened, moderately  calcified leaflets. Valve mobility was restricted. Doppler:  There was severe stenosis. There was mild regurgitation. VTI  ratio of LVOT to aortic valve: 0.3. Valve area (VTI): 1.25 cm^2.  Indexed valve area (VTI): 0.57 cm^2/m^2. Peak velocity ratio of  LVOT to aortic valve: 0.31. Valve area (Vmax): 1.29 cm^2. Indexed  valve area (Vmax): 0.59 cm^2/m^2. Mean velocity ratio of LVOT to  aortic valve: 0.32. Valve area (Vmean): 1.35 cm^2. Indexed valve  area (Vmean): 0.61 cm^2/m^2. Mean gradient (S): 30 mm Hg. Peak  gradient (S): 68 mm Hg.  -------------------------------------------------------------------  Aorta: Aortic root: The aortic root was normal in size.  -------------------------------------------------------------------  Mitral valve: Structurally normal valve. Mobility was not  restricted. Doppler: Transvalvular velocity was within the normal  range. There was no evidence for stenosis. There was mild  regurgitation. Valve area by pressure half-time: 3.1 cm^2.  Indexed valve area by pressure half-time: 1.41 cm^2/m^2. Valve area  by continuity equation (using LVOT flow): 2.62 cm^2. Indexed valve  area by continuity equation (using LVOT flow): 1.19 cm^2/m^2.  Mean gradient (D): 6 mm Hg. Peak gradient (D): 8 mm Hg.   -------------------------------------------------------------------  Left atrium: The atrium was severely dilated.  -------------------------------------------------------------------  Right ventricle: The cavity size was normal. Wall thickness was  normal. Systolic function was normal.  -------------------------------------------------------------------  Pulmonic valve: Structurally normal valve. Cusp separation was  normal. Doppler: Transvalvular velocity was within the normal  range. There was no evidence for stenosis. There was no  regurgitation.  -------------------------------------------------------------------  Tricuspid valve: Structurally normal valve. Doppler:  Transvalvular velocity was within the normal range. There was  trivial regurgitation.  -------------------------------------------------------------------  Pulmonary artery: The main pulmonary artery was normal-sized.  Systolic pressure was within the normal range.  -------------------------------------------------------------------  Right atrium: The atrium was normal in size.  -------------------------------------------------------------------  Pericardium: There was no pericardial effusion.  -------------------------------------------------------------------  Systemic veins:  Inferior vena cava: The vessel was normal in size.  -------------------------------------------------------------------  Measurements  Left ventricle Value Reference  LV ID, ED, PLAX chordal 44 mm 43 - 52  LV ID, ES, PLAX chordal 33 mm 23 - 38  LV fx shortening, PLAX chordal (L) 25 % >=29  LV PW thickness, ED 16 mm ----------  IVS/LV PW ratio, ED 1 <=1.3  Stroke volume, 2D 99 ml ----------  Stroke volume/bsa, 2D 45 ml/m^2 ----------  LV e&', lateral 9.9 cm/s ----------  LV E/e&', lateral 13.84 ----------  LV e&', medial 6.96 cm/s ----------  LV E/e&', medial 19.68 ----------  LV e&', average 8.43 cm/s ----------  LV E/e&', average  16.25 ----------  Ventricular septum Value Reference  IVS thickness, ED 16 mm ----------  LVOT Value Reference  LVOT ID, S 23 mm ----------  LVOT area 4.15 cm^2 ----------  LVOT peak velocity, S 128 cm/s ----------  LVOT mean velocity, S 76.6 cm/s ----------  LVOT VTI, S 23.9 cm ----------  LVOT peak gradient, S 7 mm Hg ----------  Aortic valve Value Reference  Aortic valve peak velocity, S 412 cm/s ----------  Aortic valve mean velocity, S 236 cm/s ----------  Aortic valve VTI, S 79.5 cm ----------  Aortic mean gradient, S 30 mm Hg ----------  Aortic peak gradient, S 68 mm Hg ----------  VTI ratio, LVOT/AV 0.3 ----------  Aortic valve area, VTI 1.25 cm^2 ----------  Aortic valve area/bsa, VTI 0.57  cm^2/m^2 ----------  Velocity ratio, peak, LVOT/AV 0.31 ----------  Aortic valve area, peak velocity 1.29 cm^2 ----------  Aortic valve area/bsa, peak 0.59 cm^2/m^2 ----------  velocity  Velocity ratio, mean, LVOT/AV 0.32 ----------  Aortic valve area, mean velocity 1.35 cm^2 ----------  Aortic valve area/bsa, mean 0.61 cm^2/m^2 ----------  velocity  Aorta Value Reference  Aortic root ID, ED 46 mm ----------  Left atrium Value Reference  LA ID, A-P, ES 71 mm ----------  LA ID/bsa, A-P (H) 3.22 cm/m^2 <=2.2  LA volume, S 215 ml ----------  LA volume/bsa, S 97.5 ml/m^2 ----------  LA volume, ES, 1-p A4C 259 ml ----------  LA volume/bsa, ES, 1-p A4C 117.5 ml/m^2 ----------  LA volume, ES, 1-p A2C 177 ml ----------  LA volume/bsa, ES, 1-p A2C 80.3 ml/m^2 ----------  Mitral valve Value Reference  Mitral E-wave peak velocity 137 cm/s ----------  Mitral mean velocity, D 102 cm/s ----------  Mitral deceleration time (H) 243 ms 150 - 230  Mitral pressure half-time 71 ms ----------  Mitral mean gradient, D 6 mm Hg ----------  Mitral peak gradient, D 8 mm Hg ----------  Mitral valve area, PHT, DP 3.1 cm^2 ----------  Mitral valve area/bsa, PHT, DP 1.41 cm^2/m^2 ----------  Mitral valve  area, LVOT 2.62 cm^2 ----------  continuity  Mitral valve area/bsa, LVOT 1.19 cm^2/m^2 ----------  continuity  Mitral annulus VTI, D 37.8 cm ----------  Right atrium Value Reference  RA ID, S-I, ES, A4C (H) 73.3 mm 34 - 49  RA area, ES, A4C (H) 31.7 cm^2 8.3 - 19.5  RA volume, ES, A/L 114 ml ----------  RA volume/bsa, ES, A/L 51.7 ml/m^2 ----------  Systemic veins Value Reference  Estimated CVP 8 mm Hg ----------  Right ventricle Value Reference  TAPSE 22 mm ----------  RV s&', lateral, S 11.4 cm/s ----------  Legend:  (L) and (H) mark values outside specified reference range.  -------------------------------------------------------------------  Prepared and Electronically Authenticated by  Jenne Campus, MD  2019-08-16T17:36:44   Physicians   Panel Physicians Referring Physician Case Authorizing Physician  Burnell Blanks, MD (Primary)    Procedures   RIGHT/LEFT HEART CATH AND CORONARY ANGIOGRAPHY  Conclusion     Prox RCA to Mid RCA lesion is 100% stenosed.  Ost 1st Mrg to 1st Mrg lesion is 40% stenosed.  Prox Cx to Mid Cx lesion is 20% stenosed.  Prox LAD lesion is 10% stenosed.  Mid LAD lesion is 40% stenosed.  Hemodynamic findings consistent with mild pulmonary hypertension.  1. Patent stent mid LAD with minimal restenosis. Mild non-obstructive disease noted in the distal LAD 2. The circumflex artery has mild disease in the mid AV groove segment. The obtuse marginal branch has mild to moderate non-obstructive disease. 3. The RCA is a moderate caliber non-dominant vessel with 100% chronic occlusion in the proximal vessel. The mid and distal vessel fills from right to right collaterals and left to right collaterals.  4. Severe aortic stenosis. Cath data with mean gradient of 20.3 mmHg, peak to peak gradient 26 mmHg). By echo, the leaflets do not open well and there appears to be severe aortic stenosis. Mean gradient 42 mmHg by echo (upon image  review, not reported in detailed final summary) and AVA 0.84 cm2.   Recommendations: Continue medical management of CAD. Will continue workup for TAVR    Indications   Severe aortic stenosis [I35.0 (ICD-10-CM)]  Procedural Details/Technique   Technical Details Indication: 77 yo male with history of CAD and severe aortic stenosis. Workup  for TAVR.   Procedure: The risks, benefits, complications, treatment options, and expected outcomes were discussed with the patient. The patient and/or family concurred with the proposed plan, giving informed consent. The patient was brought to the cath lab after IV hydration was given. The patient was sedated with Versed and Fentanyl. The IV catheter present in the right antecubital vein was prepped and draped. I changed this out for a 5 [place] sheath. Right heart catheterization performed with a balloon tipped catheter. The right wrist was prepped and draped in a sterile fashion. 1% lidocaine was used for local anesthesia. Using the modified Seldinger access technique, a 5 French sheath was placed in the right radial artery. 3 mg Verapamil was given through the sheath. I was unable to pass the wire and catheter into the aortic root from the radial approach. The right groin was prepped and draped. A 5 French sheath was placed in the right femoral artery using u/s guidance. Images printed, saved and stored. Standard diagnostic catheters were used to perform selective coronary angiography. I crossed the aortic valve with an AL-1 catheter and a straight wire. The sheath was removed from the right radial artery and a Terumo hemostasis band was applied at the arteriotomy site on the right wrist.      Estimated blood loss <50 mL.  During this procedure the patient was administered the following to achieve and maintain moderate conscious sedation: Fentanyl 50 mcg, while the patient's heart rate, blood pressure, and oxygen saturation were continuously monitored. The  period of conscious sedation was 40 minutes, of which I was present face-to-face 100% of this time.  Complications   Complications documented before study signed (10/22/2017 1:17 PM EDT)    RIGHT/LEFT HEART CATH AND CORONARY ANGIOGRAPHY   None Documented by Burnell Blanks, MD 10/22/2017 12:27 PM EDT  Time Range: Intraprocedure      Coronary Findings   Diagnostic  Dominance: Left  Left Anterior Descending  Vessel is large.  Prox LAD lesion 10% stenosed  Prox LAD lesion is 10% stenosed. The lesion was previously treated using a stent (unknown type) over 2 years ago.  Mid LAD lesion 40% stenosed  Mid LAD lesion is 40% stenosed.  Left Circumflex  Prox Cx to Mid Cx lesion 20% stenosed  Prox Cx to Mid Cx lesion is 20% stenosed.  First Obtuse Marginal Branch  Ost 1st Mrg to 1st Mrg lesion 40% stenosed  Ost 1st Mrg to 1st Mrg lesion is 40% stenosed.  Right Coronary Artery  Vessel is moderate in size.  Collaterals  Dist RCA filled by collaterals from Dist Cx.    Prox RCA to Mid RCA lesion 100% stenosed  Prox RCA to Mid RCA lesion is 100% stenosed. The lesion is chronically occluded.  Acute Marginal Branch  Collaterals  Acute Mrg filled by collaterals from Prox RCA.    Intervention   No interventions have been documented.  Right Heart   Right Heart Pressures Hemodynamic findings consistent with mild pulmonary hypertension.  Coronary Diagrams   Diagnostic Diagram       Implants       No implant documentation for this case.  MERGE Images   Show images for CARDIAC CATHETERIZATION   Link to Procedure Log   Procedure Log    Hemo Data    Most Recent Value  Fick Cardiac Output 8.39 L/min  Fick Cardiac Output Index 3.91 (L/min)/BSA  Aortic Mean Gradient 20.26 mmHg  Aortic Peak Gradient 26 mmHg  Aortic Valve Area  2.81  Aortic Value Area Index 1.31 cm2/BSA  RA A Wave 8 mmHg  RA V Wave 11 mmHg  RA Mean 7 mmHg  RV Systolic Pressure 36 mmHg    RV Diastolic Pressure 2 mmHg  RV EDP 7 mmHg  PA Systolic Pressure 40 mmHg  PA Diastolic Pressure 15 mmHg  PA Mean 25 mmHg  PW A Wave 21 mmHg  PW V Wave 29 mmHg  PW Mean 18 mmHg  AO Systolic Pressure 852 mmHg  AO Diastolic Pressure 72 mmHg  AO Mean 96 mmHg  LV Systolic Pressure 778 mmHg  LV Diastolic Pressure 3 mmHg  LV EDP 15 mmHg  AOp Systolic Pressure 242 mmHg  AOp Diastolic Pressure 75 mmHg  AOp Mean Pressure 353 mmHg  LVp Systolic Pressure 614 mmHg  LVp Diastolic Pressure 2 mmHg  LVp EDP Pressure 15 mmHg  QP/QS 1  TPVR Index 6.4 HRUI  TSVR Index 24.57 HRUI  PVR SVR Ratio 0.08  TPVR/TSVR Ratio 0.26     ADDENDUM REPORT: 11/09/2017 16:45  EXAM: OVER-READ INTERPRETATION CT CHEST  The following report is an over-read performed by radiologist Dr. Samara Snide [facility] on 11/09/2017. This over-read does not include interpretation of cardiac or coronary anatomy or pathology. The coronary CTA interpretation by the cardiologist is attached.  COMPARISON: 12/03/2013 chest radiograph.  FINDINGS: Please see the separate concurrent chest CT angiogram report for details.  IMPRESSION: Please see the separate concurrent chest CT angiogram report for details.   Electronically Signed By: Ilona Sorrel M.D. On: 11/09/2017 16:45   Addended by Sharyn Blitz, MD on 11/09/2017 4:47 PM    Study Result   CLINICAL DATA: Aortic Stenosis  EXAM: Cardiac TAVR CT  TECHNIQUE: The patient was scanned on a Siemens Force 431 slice scanner. A 120 kV retrospective scan was triggered in the ascending thoracic aorta at 140 HU's. Gantry rotation speed was 250 msecs and collimation was .6 mm. No beta blockade or nitro were given. The 3D data set was reconstructed in 5% intervals of the R-R cycle. Systolic and diastolic phases were analyzed on a dedicated work station using MPR, MIP and VRT modes. The patient received 80 cc of  contrast.  FINDINGS: Aortic Valve: Tri leaflet, calcified with restricted leaflet motion  Aorta: Moderate calcific atherosclerotic debris with bovine arch  Sino-tubular Junction: 32 mm  Ascending Thoracic Aorta: 41 mm  Aortic Arch: 34 mm  Descending Thoracic Aorta: 36 mm  Sinus of Valsalva Measurements:  Non-coronary: 39 mm  Right - coronary: 39 mm  Left - coronary: 39 mm  Coronary Artery Height above Annulus:  Left Main: 12.6 mm above annulus  Right Coronary: 21 mm above annulus  Virtual Basal Annulus Measurements:  Maximum / Minimum Diameter: 32.4 mm x 25.9 mm  Perimeter: 98.4 mm  Area: 707 mm2  Coronary Arteries: Sufficient height above annulus for deployment  Optimum Fluoroscopic Angle for Delivery: RAO 7 Cranial 7  IMPRESSION: 1. Calcified tri leaflet AV with annular area of 707 mm2 and perimeter 98.4 mm Upper limit of normal for a 29 Sapien 3 is 683 mm2 and perimeter for a 34 mm Evolut R is 94.2 mm  2. Coronary arteries sufficient height above annulus for deployment  3. No LAA thrombus  4. Mld aortic root enlargement 4.1 cm  5. Optimum angiographic angle for deployment RAO 7 degrees Cranial 7 degrees  Will need to discuss with team if patient is suitable for TAVR  Jenkins Rouge  Electronically Signed: By:  Jenkins Rouge M.D. On: 11/09/2017 15:49       CLINICAL DATA: Severe symptomatic aortic stenosis. Dyspnea on exertion. TAVR evaluation.  EXAM: CT ANGIOGRAPHY CHEST, ABDOMEN AND PELVIS  TECHNIQUE: Multidetector CT imaging through the chest, abdomen and pelvis was performed using the standard protocol during bolus administration of intravenous contrast. Multiplanar reconstructed images and MIPs were obtained and reviewed to evaluate the vascular anatomy.  CONTRAST: 145mL ISOVUE-370 IOPAMIDOL (ISOVUE-370) INJECTION 76%  COMPARISON: 07/30/2005 abdominal sonogram. 12/03/2013  chest radiograph.  FINDINGS: CTA CHEST FINDINGS  Cardiovascular: Mild-to-moderate cardiomegaly. Thickened and coarsely calcified aortic valve. No significant pericardial effusion/thickening. Left main and 3 vessel coronary atherosclerosis. Atherosclerotic thoracic aorta with ectatic 4.3 cm ascending thoracic aorta. Borderline prominent main pulmonary artery (3.3 cm diameter). No central pulmonary emboli.  Mediastinum/Nodes: No discrete thyroid nodules. Unremarkable esophagus. No axillary adenopathy. Mildly enlarged 1.3 cm left supraclavicular node (series 14/image 14). Right paratracheal adenopathy up to 1.4 cm (series 14/image 33). Mildly enlarged 1.3 cm subcarinal node (series 14/image 51). Mild bilateral hilar adenopathy up to 1.4 cm on the right (series 14/image 39) and 1.2 cm on the left (series 14/image 50).  Lungs/Pleura: No pneumothorax. No pleural effusion. Subsolid 7 mm posterior right upper lobe pulmonary nodule (series 15/image 25). No acute consolidative airspace disease, lung masses or additional significant pulmonary nodules.  Musculoskeletal: No aggressive appearing focal osseous lesions. Soft tissue anchors are noted in the left humeral head. Moderate thoracic spondylosis.  CTA ABDOMEN AND PELVIS FINDINGS  Hepatobiliary: Normal liver with no liver mass. Normal gallbladder with no radiopaque cholelithiasis. No biliary ductal dilatation.  Pancreas: Normal, with no mass or duct dilation.  Spleen: Normal size. No mass.  Adrenals/Urinary Tract: Normal adrenals. No hydronephrosis. Simple 3.9 cm posterior upper right renal cyst. Subcentimeter hypodense renal cortical lesion in the interpolar left kidney is too small to characterize and requires no follow-up. Diffuse bladder wall thickening and trabeculation with numerous scattered bladder diverticula, largest 3.8 cm in the posterior right bladder wall.  Stomach/Bowel: Normal non-distended stomach.  Normal caliber small bowel with no small bowel wall thickening. Appendectomy. Normal large bowel with no diverticulosis, large bowel wall thickening or pericolonic fat stranding.  Vascular/Lymphatic: Atherosclerotic abdominal aorta with 3.7 cm infrarenal abdominal aortic aneurysm. Patent portal, splenic, hepatic and renal veins. Asymmetric arterial phase enhancement of the right common femoral and right iliac veins (series 14/image 192), cannot exclude right common femoral artery venous fistula. No pathologically enlarged lymph nodes in the abdomen or pelvis.  Reproductive: Mildly enlarged prostate with brachytherapy seeds surrounding the prostate.  Other: No pneumoperitoneum, ascites or focal fluid collection.  Musculoskeletal: No aggressive appearing focal osseous lesions. Endplate based mixed lytic and sclerotic lesions throughout the lumbar vertebrae, favor degenerative Schmorl's nodes. Moderate lumbar spondylosis.  VASCULAR MEASUREMENTS PERTINENT TO TAVR:  AORTA:  Minimal Aortic Diameter-21.2 x 21.2 mm (abdominal aorta on series 14/image 114)  Severity of Aortic Calcification-moderate, noting mild-to-moderate atherosclerotic plaque in ascending thoracic aorta at the sinotubular junction.  RIGHT PELVIS:  Right Common Iliac Artery -  Minimal Diameter-12.9 x 12.6 mm  Tortuosity-mild  Calcification-moderate  Right External Iliac Artery -  Minimal Diameter-10.9 x 9.8 mm  Tortuosity-moderate  Calcification-none  Right Common Femoral Artery -  Minimal Diameter-10.4 x 10.0 mm  Tortuosity-mild  Calcification-none  LEFT PELVIS:  Left Common Iliac Artery -  Minimal Diameter-14.1 x 13.6 mm  Tortuosity-moderate  Calcification-moderate  Left External Iliac Artery -  Minimal Diameter-9.7 x 9.1 mm  Tortuosity-mild  Calcification-none  Left Common Femoral Artery -  Minimal Diameter-10.1 x 9.8  mm  Tortuosity-mild  Calcification-none  Review of the MIP images confirms the above findings.  IMPRESSION: 1. Vascular findings and measurements pertinent to potential TAVR procedure, as detailed above. 2. Severe thickening and calcification of the aortic valve, compatible with the reported clinical history of severe symptomatic aortic stenosis. 3. Aortic Atherosclerosis (ICD10-I70.0). 4. 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 5. Infrarenal 3.7 cm Abdominal Aortic Aneurysm (ICD10-I71.9). Recommend follow-up aortic ultrasound in 2 years. This recommendation follows ACR consensus guidelines: White Paper of the ACR Incidental Findings Committee II on Vascular Findings. J Am Coll Radiol 2013; 10:789-794. 6. 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 Recommend annual imaging followup by CTA or MRA. This recommendation follows 2010 ACCF/AHA/AATS/ACR/ASA/SCA/SCAI/SIR/STS/SVM Guidelines for the Diagnosis and Management of Patients with Thoracic Aortic Disease. Circulation. 2010; 121: W967-R916. 7. Mild-to-moderate cardiomegaly. Left main and 3 vessel coronary atherosclerosis. 8. Nonspecific mild mediastinal, bilateral hilar and left supraclavicular lymphadenopathy. These nodes can be reassessed on follow-up chest CT with IV contrast in 3-6 months. 9. Subsolid 7 mm posterior right upper lobe pulmonary nodule. Follow-up non-contrast CT recommended at 3-6 months to confirm persistence. If unchanged, and solid component remains <6 mm, annual CT is recommended until 5 years of stability has been established. If persistent these nodules should be considered highly suspicious if the solid component of the nodule is 6 mm or greater in size and enlarging. This recommendation follows the consensus statement: Guidelines for Management of Incidental Pulmonary Nodules Detected on CT  Images: From the Fleischner Society 2017; Radiology 2017; 284:228-243. 10. 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   Electronically Signed By: Ilona Sorrel M.D. On: 11/09/2017 16:44   Impression:   I have personally reviewed the patient's cardiac catheterization and CTA studies.  His cardiac catheterization shows mild nonobstructive disease in the distal LAD.  The mid LAD is stent with minimal restenosis.  The left circumflex marginal branch has mild to moderate nonobstructive disease.  The right coronary artery is a nondominant vessel with 100% chronic occlusion proximally.  The distal vessel fills by collaterals.  The mean gradient by cardiac catheterization was 20.3 with a peak to peak gradient of 26 mmHg.  By echo the gradient was 42 mmHg and the valve appears severely calcified and stenosed.  His gated cardiac CTA shows an annular dimension of 707 mm.  They report from [place] measured 676 mm.  I think this is probably suitable for a 29 mm Sapien 3 valve.  His abdominal and pelvic CTA shows adequate pelvic vasculature for transfemoral insertion.  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  An ultrasound of the right groin confirms a right femoral AV fistula that appears to come off around the level of the PFA. We will take this into account and use the left femoral artery for valve insertion and the left femoral vein for the pacer. The pigtail can be inserted via the left radial artery or else we will have to insert higher up on the right CFA.  His CT scans also had some other findings including a 7 mm sub-solid nodule in the posterior aspect of the right upper lobe that will require follow-up with CT scan in 3 to 6 months.  There is  also mild enlargement  of the ascending aorta at 4.3 cm.  This will also require continued follow-up with a CT scan in about 1 year.  He also has a 3.7 cm infrarenal abdominal aortic aneurysm that will require further follow-up.  I reviewed the CTA images with the patient and his wife and daughter and answered all their questions.  The patient and his family were counseled at length regarding treatment alternatives for management of severe symptomatic aortic stenosis. The risks and benefits of surgical intervention has been discussed in detail. Long-term prognosis with medical therapy was discussed. Alternative approaches such as conventional surgical aortic valve replacement, transcatheter aortic valve replacement, and palliative medical therapy were compared and contrasted at length. This discussion was placed in the context of the patient's own specific clinical presentation and past medical history. All of their questions have been addressed.   Following the decision to proceed with transcatheter aortic valve replacement, a discussion was held regarding what types of management strategies would be attempted intraoperatively in the event of life-threatening complications, including whether or not the patient would be considered a candidate for the use of cardiopulmonary  The patient has been advised of a variety of complications that might develop including but not limited to risks of death, stroke, paravalvular leak, aortic dissection or other major vascular complications, aortic annulus rupture, device embolization, cardiac rupture or perforation, mitral regurgitation, acute myocardial infarction, arrhythmia, heart block or bradycardia requiring permanent pacemaker placement, congestive heart failure, respiratory failure, renal failure, pneumonia, infection, other late complications related to structural valve deterioration or migration, or other complications that might ultimately cause a temporary or permanent loss of  functional independence or other long term morbidity. The patient provides full informed consent for the procedure as described and all questions were answered.    Plan:  Left transfemoral transcatheter aortic valve replacement on 11/17/2017.  Gaye Pollack, MD

## 2017-11-16 NOTE — Progress Notes (Signed)
Anesthesia Chart Review:  Case:  [id number] Date/Time:  11/17/17 0944   Procedures:      TRANSCATHETER AORTIC VALVE REPLACEMENT, TRANSFEMORAL (N/A Chest)     TRANSESOPHAGEAL ECHOCARDIOGRAM (TEE) (N/A )   Anesthesia type:  General   Pre-op diagnosis:  Severe Aortic Stenosis   Location:  [facility] ROOM 16 / [facility]   Surgeon:  Burnell Blanks, MD      DISCUSSION: Patient is a 77 year old male scheduled for the above procedure.  History includes CAD (s/p mid LAD stent > 2 years ago; patent LAD stent, chronically occluded prox-mid RCA, acute marginal filled by collaterals from [facility] 10/2017), severe, AS, a-fib, HLD, prostate cancer, former smoker (quit '62). Recent CT showed 4.3 cm ascending TAA with 1 year follow-up recommended, 3.7 cm AAA with 2 year follow-up recommended, 7 mm RUL pulmonary nodule with 3-6 month follow-up recommended, and right common femoral AVF (confirmed with ultrasound).  Right femoral AVF thought likely occurred after his cardiac cath. The TAVR team discussed line placement with plan to use left radial for pigtail, left femoral vein for pacing wire and left femoral artery for delivery sheath.   He is on Eliquis and ASA. Last dose Eliquis 11/11/17.    VS: BP 126/62   Pulse 69   Temp 36.6 C   Resp 18   Ht 5' 10.5" (1.791 m)   Wt 93.7 kg   SpO2 95%   BMI 29.21 kg/m   PROVIDERS: Greig Right, MD is PCP Jyl Heinz, MD is primary cardiologist.  Lauree Chandler, MD is cardiologist (for AS/TAVR)   LABS: Labs reviewed: Acceptable for surgery. (all labs ordered are listed, but only abnormal results are displayed)  Labs Reviewed  SURGICAL PCR SCREEN - Abnormal; Notable for the following components:      Result Value   Staphylococcus aureus POSITIVE (*)    All other components within normal limits  BLOOD GAS, ARTERIAL - Abnormal; Notable for the following components:   Bicarbonate 28.6 (*)    Acid-Base Excess 4.6 (*)    All other components within  normal limits  BRAIN NATRIURETIC PEPTIDE - Abnormal; Notable for the following components:   B Natriuretic Peptide 206.3 (*)    All other components within normal limits  CBC - Abnormal; Notable for the following components:   Platelets 141 (*)    All other components within normal limits  COMPREHENSIVE METABOLIC PANEL - Abnormal; Notable for the following components:   Glucose, Bld 190 (*)    All other components within normal limits  HEMOGLOBIN A1C - Abnormal; Notable for the following components:   Hgb A1c MFr Bld 5.8 (*)    All other components within normal limits  APTT  PROTIME-INR  URINALYSIS, ROUTINE W REFLEX MICROSCOPIC  TYPE AND SCREEN  ABO/RH    IMAGES: CXR 11/13/17: IMPRESSION: Stable cardiomegaly without overt pulmonary edema. No active pulmonary disease.  CTA chest/abd/pelvis 11/09/17: IMPRESSION: 1. Vascular findings and measurements pertinent to potential TAVR procedure, as detailed above. 2. Severe thickening and calcification of the aortic valve, compatible with the reported clinical history of severe symptomatic aortic stenosis. 3. Aortic Atherosclerosis (ICD10-I70.0). 4. 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 5. Infrarenal 3.7 cm Abdominal Aortic Aneurysm (ICD10-I71.9). Recommend follow-up aortic ultrasound in 2 years 6. 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 Recommend annual imaging followup by CTA or MRA.  7. Mild-to-moderate cardiomegaly. Left main and 3 vessel  coronary atherosclerosis. 8. Nonspecific mild mediastinal, bilateral hilar and left supraclavicular lymphadenopathy. These nodes can be reassessed on follow-up chest CT with IV contrast in 3-6 months. 9. Subsolid 7 mm posterior right upper lobe pulmonary nodule. Follow-up non-contrast CT recommended at 3-6 months to confirm persistence. If unchanged, and solid component remains <6  mm, annual CT is recommended until 5 years of stability has been established. If persistent these nodules should be considered highly suspicious if the solid component of the nodule is 6 mm or greater in size and enlarging.  10. 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   EKG: 11/13/17: Afib at 73 bpm, LVH, non-specific ST/T wave abnormality.   CV:  Right femoral U/S 11/11/17: Final Interpretation Findings are suggestive of an arteriovenous fistula that appears to originate at the proximal profunda femoral artery of the right lower extremity.  Carotid U/S 11/09/17: Final Interpretation: Right Carotid: Velocities in the right ICA are consistent with a 1-39% stenosis. Left Carotid: Velocities in the left ICA are consistent with a 1-39% stenosis. Vertebrals: Right vertebral artery demonstrates antegrade flow. Left vertebral      artery demonstrates high resistant flow.  CT coronary 11/09/17: IMPRESSION: 1. Calcified tri leaflet AV with annular area of 707 mm2 and perimeter 98.4 mm Upper limit of normal for a 29 Sapien 3 is 683 mm2 and perimeter for a 34 mm Evolut R is 94.2 mm 2.  Coronary arteries sufficient height above annulus for deployment 3.  No LAA thrombus 4.  Mld aortic root enlargement 4.1 cm 5. Optimum angiographic angle for deployment RAO 7 degrees Cranial 7 degrees  Cardiac cath 10/22/17:  Prox RCA to Mid RCA lesion is 100% stenosed.  Ost 1st Mrg to 1st Mrg lesion is 40% stenosed.  Prox Cx to Mid Cx lesion is 20% stenosed.  Prox LAD lesion is 10% stenosed.  Mid LAD lesion is 40% stenosed.  Hemodynamic findings consistent with mild pulmonary hypertension. 1. Patent stent mid LAD with minimal restenosis. Mild non-obstructive disease noted in the distal LAD 2. The circumflex artery has mild disease in the mid AV groove segment. The obtuse  marginal branch has mild to moderate non-obstructive disease. 3. The RCA is a moderate caliber non-dominant vessel with 100% chronic occlusion in the proximal vessel. The mid and distal vessel fills from right to right collaterals and left to right collaterals.  4. Severe aortic stenosis. Cath data with mean gradient of 20.3 mmHg, peak to peak gradient 26 mmHg). By echo, the leaflets do not open well and there appears to be severe aortic stenosis. Mean gradient 42 mmHg by echo (upon image review, not reported in detailed final summary) and AVA 0.84 cm2.  Recommendations: Continue medical management of CAD. Will continue workup for TAVR.  Echo 09/25/17: Study Conclusions - Left ventricle: The cavity size was normal. Wall thickness was   increased in a pattern of moderate LVH. Systolic function was   normal. The estimated ejection fraction was in the range of 55%   to 60%. Wall motion was normal; there were no regional wall   motion abnormalities. The study is not technically sufficient to   allow evaluation of LV diastolic function. - Aortic valve: Valve mobility was restricted. There was severe   stenosis. There was mild regurgitation. Valve area (VTI): 1.25   cm^2. Valve area (Vmax): 1.29 cm^2. Valve area (Vmean): 1.35   cm^2. - Mitral valve: There was mild  regurgitation. Valve area by   continuity equation (using LVOT flow): 2.62 cm^2. - Left atrium: The atrium was severely dilated. Impressions: - Severe AS.   Normal LVEF.   Severe LAE.   Past Medical History:  Diagnosis Date  . Aortic stenosis 08/07/2017  . Atrial fibrillation ([place]) 08/07/2017  . CAD (coronary artery disease) 08/07/2017  . Hyperlipidemia   . Osteoarthritis 08/07/2017  . Prostate cancer [facility])     Past Surgical History:  Procedure Laterality Date  . APPENDECTOMY    . CARDIAC CATHETERIZATION    . REPLACEMENT TOTAL KNEE BILATERAL    . RIGHT/LEFT HEART CATH AND CORONARY ANGIOGRAPHY N/A 10/22/2017   Procedure:  RIGHT/LEFT HEART CATH AND CORONARY ANGIOGRAPHY;  Surgeon: Burnell Blanks, MD;  Location: [facility];  Service: Cardiovascular;  Laterality: N/A;  . SHOULDER SURGERY Left 2018    MEDICATIONS: . apixaban (ELIQUIS) 5 MG TABS tablet  . aspirin 81 MG tablet  . BUFFERIN EXTRA STRENGTH 500 MG TABS  . lansoprazole (PREVACID) 15 MG capsule  . metoprolol tartrate (LOPRESSOR) 50 MG tablet  . morphine (MSIR) 15 MG tablet  . Omega-3 Fatty Acids (FISH OIL) 1000 MG CAPS  . Polyethyl Glycol-Propyl Glycol (LUBRICANT EYE DROPS) 0.4-0.3 % SOLN  . simvastatin (ZOCOR) 40 MG tablet  . tamsulosin (FLOMAX) 0.4 MG CAPS capsule   No current facility-administered medications for this encounter.    Derrill Memo ON 11/17/2017] dexmedetomidine (PRECEDEX) 400 MCG/100ML (4 mcg/mL) infusion  . [START ON 11/17/2017] DOPamine (INTROPIN) 800 mg in dextrose 5 % 250 mL (3.2 mg/mL) infusion  . [START ON 11/17/2017] EPINEPHrine (ADRENALIN) 4 mg in dextrose 5 % 250 mL (0.016 mg/mL) infusion  . [START ON 11/17/2017] heparin 30,000 units/NS 1000 mL solution for CELLSAVER  . [START ON 11/17/2017] insulin regular (NOVOLIN R,HUMULIN R) 100 Units in sodium chloride 0.9 % 100 mL (1 Units/mL) infusion  . [START ON 11/17/2017] levofloxacin (LEVAQUIN) IVPB 500 mg  . [START ON 11/17/2017] magnesium sulfate (IV Push/IM) injection 40 mEq  . [START ON 11/17/2017] nitroGLYCERIN 50 mg in dextrose 5 % 250 mL (0.2 mg/mL) infusion  . [START ON 11/17/2017] norepinephrine (LEVOPHED) 4mg  in D5W 274mL premix infusion  . [START ON 11/17/2017] phenylephrine (NEOSYNEPHRINE) 20-0.9 MG/250ML-% infusion  . [START ON 11/17/2017] potassium chloride injection 80 mEq  . [START ON 11/17/2017] vancomycin (VANCOCIN) 1,500 mg in sodium chloride 0.9 % 250 mL IVPB    [facility] Short Stay Center/Anesthesiology Phone [phone] 11/16/2017 3:59 PM

## 2017-11-17 ENCOUNTER — Other Ambulatory Visit: Payer: Self-pay

## 2017-11-17 ENCOUNTER — Encounter (HOSPITAL_COMMUNITY): Payer: Self-pay

## 2017-11-17 ENCOUNTER — Inpatient Hospital Stay (HOSPITAL_COMMUNITY): Payer: Medicare HMO

## 2017-11-17 ENCOUNTER — Ambulatory Visit (HOSPITAL_COMMUNITY)
Admission: RE | Admit: 2017-11-17 | Discharge: 2017-11-17 | Disposition: A | Discharge status: Home / Self Care | Payer: Medicare HMO | Source: Ambulatory Visit | Attending: Cardiovascular Disease | Admitting: Cardiovascular Disease

## 2017-11-17 ENCOUNTER — Encounter (HOSPITAL_COMMUNITY)
Admission: RE | Disposition: A | Discharge status: Home / Self Care | Payer: Self-pay | Source: Home / Self Care | Attending: Cardiovascular Disease

## 2017-11-17 ENCOUNTER — Inpatient Hospital Stay (HOSPITAL_COMMUNITY): Payer: Medicare HMO | Admitting: Vascular Surgery

## 2017-11-17 ENCOUNTER — Inpatient Hospital Stay (HOSPITAL_COMMUNITY)
Admission: RE | Admit: 2017-11-17 | Discharge: 2017-11-18 | DRG: 267 | Disposition: A | Discharge status: Home / Self Care | Payer: Medicare HMO | Attending: Cardiovascular Disease | Admitting: Cardiovascular Disease

## 2017-11-17 ENCOUNTER — Inpatient Hospital Stay (HOSPITAL_COMMUNITY): Payer: Medicare HMO | Admitting: Anesthesiology

## 2017-11-17 ENCOUNTER — Other Ambulatory Visit: Payer: Self-pay | Admitting: Physician Assistant

## 2017-11-17 DIAGNOSIS — Z7901 Long term (current) use of anticoagulants: Secondary | ICD-10-CM

## 2017-11-17 DIAGNOSIS — Z7982 Long term (current) use of aspirin: Secondary | ICD-10-CM

## 2017-11-17 DIAGNOSIS — Z888 Allergy status to other drugs, medicaments and biological substances status: Secondary | ICD-10-CM

## 2017-11-17 DIAGNOSIS — Z955 Presence of coronary angioplasty implant and graft: Secondary | ICD-10-CM | POA: Diagnosis not present

## 2017-11-17 DIAGNOSIS — Z952 Presence of prosthetic heart valve: Secondary | ICD-10-CM

## 2017-11-17 DIAGNOSIS — I770 Arteriovenous fistula, acquired: Secondary | ICD-10-CM | POA: Diagnosis not present

## 2017-11-17 DIAGNOSIS — R911 Solitary pulmonary nodule: Secondary | ICD-10-CM | POA: Diagnosis not present

## 2017-11-17 DIAGNOSIS — I350 Nonrheumatic aortic (valve) stenosis: Principal | ICD-10-CM | POA: Diagnosis present

## 2017-11-17 DIAGNOSIS — I77810 Thoracic aortic ectasia: Secondary | ICD-10-CM | POA: Diagnosis present

## 2017-11-17 DIAGNOSIS — Z881 Allergy status to other antibiotic agents status: Secondary | ICD-10-CM | POA: Diagnosis not present

## 2017-11-17 DIAGNOSIS — C61 Malignant neoplasm of prostate: Secondary | ICD-10-CM | POA: Diagnosis not present

## 2017-11-17 DIAGNOSIS — I714 Abdominal aortic aneurysm, without rupture: Secondary | ICD-10-CM | POA: Diagnosis present

## 2017-11-17 DIAGNOSIS — I10 Essential (primary) hypertension: Secondary | ICD-10-CM | POA: Diagnosis not present

## 2017-11-17 DIAGNOSIS — Z006 Encounter for examination for normal comparison and control in clinical research program: Secondary | ICD-10-CM | POA: Diagnosis not present

## 2017-11-17 DIAGNOSIS — Z9861 Coronary angioplasty status: Secondary | ICD-10-CM

## 2017-11-17 DIAGNOSIS — R590 Localized enlarged lymph nodes: Secondary | ICD-10-CM | POA: Diagnosis not present

## 2017-11-17 DIAGNOSIS — Z96653 Presence of artificial knee joint, bilateral: Secondary | ICD-10-CM | POA: Diagnosis present

## 2017-11-17 DIAGNOSIS — E785 Hyperlipidemia, unspecified: Secondary | ICD-10-CM | POA: Diagnosis not present

## 2017-11-17 DIAGNOSIS — I351 Nonrheumatic aortic (valve) insufficiency: Secondary | ICD-10-CM | POA: Diagnosis not present

## 2017-11-17 DIAGNOSIS — E119 Type 2 diabetes mellitus without complications: Secondary | ICD-10-CM | POA: Diagnosis present

## 2017-11-17 DIAGNOSIS — Z79899 Other long term (current) drug therapy: Secondary | ICD-10-CM | POA: Diagnosis not present

## 2017-11-17 DIAGNOSIS — I2510 Atherosclerotic heart disease of native coronary artery without angina pectoris: Secondary | ICD-10-CM | POA: Diagnosis not present

## 2017-11-17 DIAGNOSIS — M1990 Unspecified osteoarthritis, unspecified site: Secondary | ICD-10-CM | POA: Diagnosis present

## 2017-11-17 DIAGNOSIS — I482 Chronic atrial fibrillation, unspecified: Secondary | ICD-10-CM | POA: Diagnosis not present

## 2017-11-17 DIAGNOSIS — I4891 Unspecified atrial fibrillation: Secondary | ICD-10-CM | POA: Diagnosis not present

## 2017-11-17 DIAGNOSIS — Z87891 Personal history of nicotine dependence: Secondary | ICD-10-CM | POA: Diagnosis not present

## 2017-11-17 DIAGNOSIS — J9811 Atelectasis: Secondary | ICD-10-CM | POA: Diagnosis not present

## 2017-11-17 HISTORY — DX: Presence of prosthetic heart valve: Z95.2

## 2017-11-17 HISTORY — PX: TRANSCATHETER AORTIC VALVE REPLACEMENT, TRANSFEMORAL: SHX6400

## 2017-11-17 HISTORY — DX: Chronic atrial fibrillation, unspecified: I48.20

## 2017-11-17 HISTORY — DX: Nonrheumatic aortic (valve) stenosis: I35.0

## 2017-11-17 HISTORY — PX: INTRAOPERATIVE TRANSTHORACIC ECHOCARDIOGRAM: SHX6523

## 2017-11-17 LAB — POCT I-STAT, CHEM 8
BUN: 14 mg/dL (ref 8–23)
BUN: 13 mg/dL (ref 8–23)
BUN: 14 mg/dL (ref 8–23)
CALCIUM ION: 1.25 mmol/L (ref 1.15–1.40)
CALCIUM ION: 1.25 mmol/L (ref 1.15–1.40)
CHLORIDE: 102 mmol/L (ref 98–111)
CREATININE: 0.6 mg/dL — AB (ref 0.61–1.24)
CREATININE: 0.6 mg/dL — AB (ref 0.61–1.24)
Calcium, Ion: 1.29 mmol/L (ref 1.15–1.40)
Chloride: 102 mmol/L (ref 98–111)
Chloride: 101 mmol/L (ref 98–111)
Creatinine, Ser: 0.6 mg/dL — ABNORMAL LOW (ref 0.61–1.24)
GLUCOSE: 172 mg/dL — AB (ref 70–99)
Glucose, Bld: 143 mg/dL — ABNORMAL HIGH (ref 70–99)
Glucose, Bld: 162 mg/dL — ABNORMAL HIGH (ref 70–99)
HCT: 39 % (ref 39.0–52.0)
HCT: 37 % — ABNORMAL LOW (ref 39.0–52.0)
HCT: 39 % (ref 39.0–52.0)
HEMOGLOBIN: 12.6 g/dL — AB (ref 13.0–17.0)
Hemoglobin: 13.3 g/dL (ref 13.0–17.0)
Hemoglobin: 13.3 g/dL (ref 13.0–17.0)
Potassium: 4 mmol/L (ref 3.5–5.1)
Potassium: 4.4 mmol/L (ref 3.5–5.1)
Potassium: 4.5 mmol/L (ref 3.5–5.1)
SODIUM: 139 mmol/L (ref 135–145)
Sodium: 141 mmol/L (ref 135–145)
Sodium: 140 mmol/L (ref 135–145)
TCO2: 28 mmol/L (ref 22–32)
TCO2: 27 mmol/L (ref 22–32)
TCO2: 28 mmol/L (ref 22–32)

## 2017-11-17 SURGERY — IMPLANTATION, AORTIC VALVE, TRANSCATHETER, FEMORAL APPROACH
Anesthesia: Monitor Anesthesia Care | Site: Chest

## 2017-11-17 MED ORDER — DIPHENHYDRAMINE HCL 50 MG/ML IJ SOLN
INTRAMUSCULAR | Status: DC | PRN
  Administered 2017-11-17: 12.5 mg via INTRAVENOUS

## 2017-11-17 MED ORDER — TRAMADOL HCL 50 MG PO TABS: 50.0000 mg | ORAL_TABLET | ORAL | Status: DC | PRN

## 2017-11-17 MED ORDER — CHLORHEXIDINE GLUCONATE 0.12 % MT SOLN
15.0000 mL | Freq: Once | OROMUCOSAL | Status: AC
  Administered 2017-11-17: 15 mL via OROMUCOSAL
  Filled 2017-11-17: qty 15

## 2017-11-17 MED ORDER — CHLORHEXIDINE GLUCONATE 4 % EX LIQD: 60.0000 mL | Freq: Once | CUTANEOUS | Status: DC

## 2017-11-17 MED ORDER — ASPIRIN EC 81 MG PO TBEC
81.0000 mg | DELAYED_RELEASE_TABLET | Freq: Every day | ORAL | Status: DC
  Administered 2017-11-17 – 2017-11-18 (×2): 81 mg via ORAL
  Filled 2017-11-17 (×2): qty 1

## 2017-11-17 MED ORDER — FENTANYL CITRATE (PF) 250 MCG/5ML IJ SOLN
INTRAMUSCULAR | Status: DC | PRN
  Administered 2017-11-17: 25 ug via INTRAVENOUS
  Administered 2017-11-17: 50 ug via INTRAVENOUS
  Administered 2017-11-17 (×2): 25 ug via INTRAVENOUS
  Administered 2017-11-17: 50 ug via INTRAVENOUS

## 2017-11-17 MED ORDER — SODIUM CHLORIDE 0.9 % IV SOLN: INTRAVENOUS | Status: DC

## 2017-11-17 MED ORDER — LIDOCAINE 2% (20 MG/ML) 5 ML SYRINGE
INTRAMUSCULAR | Status: AC
  Filled 2017-11-17: qty 10

## 2017-11-17 MED ORDER — SODIUM CHLORIDE 0.9 % IV SOLN
INTRAVENOUS | Status: DC | PRN
  Administered 2017-11-17: 1500 mL

## 2017-11-17 MED ORDER — LIDOCAINE HCL 1 % IJ SOLN
INTRAMUSCULAR | Status: AC
  Filled 2017-11-17: qty 20

## 2017-11-17 MED ORDER — FENTANYL CITRATE (PF) 100 MCG/2ML IJ SOLN
100.0000 ug | Freq: Once | INTRAMUSCULAR | Status: AC
  Administered 2017-11-17: 50 ug via INTRAVENOUS
  Filled 2017-11-17: qty 2

## 2017-11-17 MED ORDER — PANTOPRAZOLE SODIUM 20 MG PO TBEC
20.0000 mg | DELAYED_RELEASE_TABLET | Freq: Every day | ORAL | Status: DC
  Administered 2017-11-17 – 2017-11-18 (×2): 20 mg via ORAL
  Filled 2017-11-17 (×2): qty 1

## 2017-11-17 MED ORDER — SODIUM CHLORIDE 0.9 % IV SOLN: 250.0000 mL | INTRAVENOUS | Status: DC | PRN

## 2017-11-17 MED ORDER — LIDOCAINE HCL 1 % IJ SOLN
INTRAMUSCULAR | Status: DC | PRN
  Administered 2017-11-17: 8 mL

## 2017-11-17 MED ORDER — ONDANSETRON HCL 4 MG/2ML IJ SOLN: 4.0000 mg | Freq: Four times a day (QID) | INTRAMUSCULAR | Status: DC | PRN

## 2017-11-17 MED ORDER — VANCOMYCIN HCL IN DEXTROSE 1-5 GM/200ML-% IV SOLN
1000.0000 mg | Freq: Once | INTRAVENOUS | Status: AC
  Administered 2017-11-17: 1000 mg via INTRAVENOUS
  Filled 2017-11-17: qty 200

## 2017-11-17 MED ORDER — PROTAMINE SULFATE 10 MG/ML IV SOLN
INTRAVENOUS | Status: DC | PRN
  Administered 2017-11-17: 20 mg via INTRAVENOUS
  Administered 2017-11-17 (×3): 40 mg via INTRAVENOUS

## 2017-11-17 MED ORDER — SODIUM CHLORIDE 0.9% FLUSH: 3.0000 mL | INTRAVENOUS | Status: DC | PRN

## 2017-11-17 MED ORDER — CHLORHEXIDINE GLUCONATE 4 % EX LIQD: 30.0000 mL | CUTANEOUS | Status: DC

## 2017-11-17 MED ORDER — FENTANYL CITRATE (PF) 250 MCG/5ML IJ SOLN
INTRAMUSCULAR | Status: AC
  Filled 2017-11-17: qty 5

## 2017-11-17 MED ORDER — ACETAMINOPHEN 650 MG RE SUPP: 650.0000 mg | Freq: Four times a day (QID) | RECTAL | Status: DC | PRN

## 2017-11-17 MED ORDER — PHENYLEPHRINE 40 MCG/ML (10ML) SYRINGE FOR IV PUSH (FOR BLOOD PRESSURE SUPPORT)
PREFILLED_SYRINGE | INTRAVENOUS | Status: AC
  Filled 2017-11-17: qty 20

## 2017-11-17 MED ORDER — SODIUM CHLORIDE 0.9% FLUSH
3.0000 mL | Freq: Two times a day (BID) | INTRAVENOUS | Status: DC
  Administered 2017-11-18: 3 mL via INTRAVENOUS

## 2017-11-17 MED ORDER — IODIXANOL 320 MG/ML IV SOLN
INTRAVENOUS | Status: DC | PRN
  Administered 2017-11-17: 136.5 mL via INTRAVENOUS

## 2017-11-17 MED ORDER — SIMVASTATIN 40 MG PO TABS
40.0000 mg | ORAL_TABLET | Freq: Every day | ORAL | Status: DC
  Administered 2017-11-17: 40 mg via ORAL
  Filled 2017-11-17: qty 1

## 2017-11-17 MED ORDER — NITROGLYCERIN IN D5W 200-5 MCG/ML-% IV SOLN: 0.0000 ug/min | INTRAVENOUS | Status: DC

## 2017-11-17 MED ORDER — LACTATED RINGERS IV SOLN
INTRAVENOUS | Status: DC | PRN
  Administered 2017-11-17: 10:00:00 via INTRAVENOUS

## 2017-11-17 MED ORDER — DIPHENHYDRAMINE HCL 50 MG/ML IJ SOLN
INTRAMUSCULAR | Status: AC
  Filled 2017-11-17: qty 1

## 2017-11-17 MED ORDER — SODIUM CHLORIDE 0.9 % IV SOLN
INTRAVENOUS | Status: DC
  Administered 2017-11-17: 10:00:00 via INTRAVENOUS

## 2017-11-17 MED ORDER — CHLORHEXIDINE GLUCONATE CLOTH 2 % EX PADS
6.0000 | MEDICATED_PAD | Freq: Every day | CUTANEOUS | Status: DC
  Administered 2017-11-18: 6 via TOPICAL

## 2017-11-17 MED ORDER — METOPROLOL TARTRATE 5 MG/5ML IV SOLN: 2.5000 mg | INTRAVENOUS | Status: DC | PRN

## 2017-11-17 MED ORDER — MORPHINE SULFATE (PF) 2 MG/ML IV SOLN: 2.0000 mg | INTRAVENOUS | Status: DC | PRN

## 2017-11-17 MED ORDER — PROPOFOL 500 MG/50ML IV EMUL
INTRAVENOUS | Status: DC | PRN
  Administered 2017-11-17: 10 ug/kg/min via INTRAVENOUS

## 2017-11-17 MED ORDER — LEVOFLOXACIN IN D5W 750 MG/150ML IV SOLN
750.0000 mg | INTRAVENOUS | Status: AC
  Administered 2017-11-18: 750 mg via INTRAVENOUS
  Filled 2017-11-17: qty 150

## 2017-11-17 MED ORDER — MIDAZOLAM HCL 2 MG/2ML IJ SOLN
INTRAMUSCULAR | Status: AC
  Filled 2017-11-17: qty 2

## 2017-11-17 MED ORDER — MORPHINE SULFATE 15 MG PO TABS
15.0000 mg | ORAL_TABLET | Freq: Three times a day (TID) | ORAL | Status: DC | PRN
  Administered 2017-11-17 – 2017-11-18 (×3): 15 mg via ORAL
  Filled 2017-11-17 (×3): qty 1

## 2017-11-17 MED ORDER — PROTAMINE SULFATE 10 MG/ML IV SOLN
INTRAVENOUS | Status: AC
  Filled 2017-11-17: qty 5

## 2017-11-17 MED ORDER — OXYCODONE HCL 5 MG PO TABS: 5.0000 mg | ORAL_TABLET | ORAL | Status: DC | PRN

## 2017-11-17 MED ORDER — PHENYLEPHRINE HCL-NACL 20-0.9 MG/250ML-% IV SOLN: 0.0000 ug/min | INTRAVENOUS | Status: DC

## 2017-11-17 MED ORDER — MUPIROCIN 2 % EX OINT
1.0000 "application " | TOPICAL_OINTMENT | Freq: Two times a day (BID) | CUTANEOUS | Status: DC
  Administered 2017-11-17 – 2017-11-18 (×2): 1 via NASAL

## 2017-11-17 MED ORDER — TAMSULOSIN HCL 0.4 MG PO CAPS
0.4000 mg | ORAL_CAPSULE | Freq: Every day | ORAL | Status: DC
  Administered 2017-11-17 – 2017-11-18 (×2): 0.4 mg via ORAL
  Filled 2017-11-17 (×2): qty 1

## 2017-11-17 MED ORDER — LIDOCAINE 2% (20 MG/ML) 5 ML SYRINGE
INTRAMUSCULAR | Status: DC | PRN
  Administered 2017-11-17: 50 mg via INTRAVENOUS

## 2017-11-17 MED ORDER — HEPARIN SODIUM (PORCINE) 1000 UNIT/ML IJ SOLN
INTRAMUSCULAR | Status: DC | PRN
  Administered 2017-11-17: 14000 [IU] via INTRAVENOUS

## 2017-11-17 MED ORDER — SODIUM CHLORIDE 0.9 % IV SOLN
INTRAVENOUS | Status: DC | PRN
  Administered 2017-11-17: 25 ug/min via INTRAVENOUS

## 2017-11-17 MED ORDER — ACETAMINOPHEN 325 MG PO TABS: 650.0000 mg | ORAL_TABLET | Freq: Four times a day (QID) | ORAL | Status: DC | PRN

## 2017-11-17 MED ORDER — SODIUM CHLORIDE 0.9 % IV SOLN
INTRAVENOUS | Status: AC
  Filled 2017-11-17 (×3): qty 1.2

## 2017-11-17 MED ORDER — VERAPAMIL HCL 2.5 MG/ML IV SOLN
INTRAVENOUS | Status: DC | PRN
  Administered 2017-11-17: 3 mg via INTRA_ARTERIAL

## 2017-11-17 SURGICAL SUPPLY — 98 items
ADH SKN CLS APL DERMABOND .7 (GAUZE/BANDAGES/DRESSINGS) ×2
ADH SKN CLS LQ APL DERMABOND (GAUZE/BANDAGES/DRESSINGS) ×2
BAG DECANTER FOR FLEXI CONT (MISCELLANEOUS) IMPLANT
BAG SNAP BAND KOVER 36X36 (MISCELLANEOUS) ×7 IMPLANT
BLADE CLIPPER SURG (BLADE) IMPLANT
BLADE OSCILLATING /SAGITTAL (BLADE) IMPLANT
BLADE STERNUM SYSTEM 6 (BLADE) IMPLANT
CABLE ADAPT CONN TEMP 6FT (ADAPTER) ×4 IMPLANT
CANNULA FEM VENOUS REMOTE 22FR (CANNULA) IMPLANT
CANNULA OPTISITE PERFUSION 16F (CANNULA) IMPLANT
CANNULA OPTISITE PERFUSION 18F (CANNULA) IMPLANT
CATH DIAG EXPO 6F FR4 (CATHETERS) ×3 IMPLANT
CATH DIAG EXPO 6F VENT PIG 145 (CATHETERS) ×14 IMPLANT
CATH EXPO 5FR AL1 (CATHETERS) IMPLANT
CATH INFINITI 6F AL2 (CATHETERS) ×3 IMPLANT
CATH S G BIP PACING (SET/KITS/TRAYS/PACK) ×4 IMPLANT
CLIP VESOCCLUDE MED 24/CT (CLIP) IMPLANT
CLIP VESOCCLUDE SM WIDE 24/CT (CLIP) IMPLANT
CONT SPEC 4OZ CLIKSEAL STRL BL (MISCELLANEOUS) ×8 IMPLANT
COVER BACK TABLE 24X17X13 BIG (DRAPES) IMPLANT
COVER BACK TABLE 80X110 HD (DRAPES) ×4 IMPLANT
COVER DOME SNAP 22 D (MISCELLANEOUS) IMPLANT
COVER WAND RF STERILE (DRAPES) ×4 IMPLANT
CRADLE DONUT ADULT HEAD (MISCELLANEOUS) ×4 IMPLANT
DERMABOND ADHESIVE PROPEN (GAUZE/BANDAGES/DRESSINGS) ×2
DERMABOND ADVANCED (GAUZE/BANDAGES/DRESSINGS) ×2
DERMABOND ADVANCED .7 DNX12 (GAUZE/BANDAGES/DRESSINGS) ×2 IMPLANT
DERMABOND ADVANCED .7 DNX6 (GAUZE/BANDAGES/DRESSINGS) ×1 IMPLANT
DEVICE CLOSURE PERCLS PRGLD 6F (VASCULAR PRODUCTS) ×4 IMPLANT
DEVICE RAD COMP TR BAND LRG (VASCULAR PRODUCTS) ×6 IMPLANT
DRAPE BRACHIAL (DRAPES) ×3 IMPLANT
DRAPE INCISE IOBAN 66X45 STRL (DRAPES) IMPLANT
DRSG TEGADERM 4X4.75 (GAUZE/BANDAGES/DRESSINGS) ×8 IMPLANT
ELECT CAUTERY BLADE 6.4 (BLADE) IMPLANT
ELECT REM PT RETURN 9FT ADLT (ELECTROSURGICAL) ×8
ELECTRODE REM PT RTRN 9FT ADLT (ELECTROSURGICAL) ×4 IMPLANT
FELT TEFLON 6X6 (MISCELLANEOUS) IMPLANT
FEMORAL VENOUS CANN RAP (CANNULA) IMPLANT
GAUZE SPONGE 4X4 12PLY STRL (GAUZE/BANDAGES/DRESSINGS) ×4 IMPLANT
GLIDESHEATH SLEND SS 6F .021 (SHEATH) ×3 IMPLANT
GLOVE BIO SURGEON STRL SZ7.5 (GLOVE) ×4 IMPLANT
GLOVE BIO SURGEON STRL SZ8 (GLOVE) IMPLANT
GLOVE EUDERMIC 7 POWDERFREE (GLOVE) IMPLANT
GLOVE ORTHO TXT STRL SZ7.5 (GLOVE) IMPLANT
GOWN STRL REUS W/ TWL LRG LVL3 (GOWN DISPOSABLE) IMPLANT
GOWN STRL REUS W/ TWL XL LVL3 (GOWN DISPOSABLE) ×2 IMPLANT
GOWN STRL REUS W/TWL LRG LVL3 (GOWN DISPOSABLE)
GOWN STRL REUS W/TWL XL LVL3 (GOWN DISPOSABLE) ×4
GUIDEWIRE INQWIRE 1.5J.035X260 (WIRE) ×1 IMPLANT
GUIDEWIRE SAFE TJ AMPLATZ EXST (WIRE) ×4 IMPLANT
GUIDEWIRE STRAIGHT .035 260CM (WIRE) ×4 IMPLANT
INQWIRE 1.5J .035X260CM (WIRE) ×4
INSERT FOGARTY SM (MISCELLANEOUS) IMPLANT
KIT BASIN OR (CUSTOM PROCEDURE TRAY) ×4 IMPLANT
KIT DILATOR VASC 18G NDL (KITS) IMPLANT
KIT HEART LEFT (KITS) ×4 IMPLANT
KIT SUCTION CATH 14FR (SUCTIONS) IMPLANT
KIT TURNOVER KIT B (KITS) ×4 IMPLANT
LOOP VESSEL MAXI BLUE (MISCELLANEOUS) IMPLANT
LOOP VESSEL MINI RED (MISCELLANEOUS) IMPLANT
NDL PERC 18GX7CM (NEEDLE) ×1 IMPLANT
NEEDLE 22X1 1/2 (OR ONLY) (NEEDLE) IMPLANT
NEEDLE PERC 18GX7CM (NEEDLE) ×4 IMPLANT
NS IRRIG 1000ML POUR BTL (IV SOLUTION) ×4 IMPLANT
PACK ENDOVASCULAR (PACKS) ×4 IMPLANT
PAD ARMBOARD 7.5X6 YLW CONV (MISCELLANEOUS) ×8 IMPLANT
PAD ELECT DEFIB RADIOL ZOLL (MISCELLANEOUS) ×4 IMPLANT
PENCIL BUTTON HOLSTER BLD 10FT (ELECTRODE) IMPLANT
PERCLOSE PROGLIDE 6F (VASCULAR PRODUCTS) ×8
SET MICROPUNCTURE 5F STIFF (MISCELLANEOUS) ×4 IMPLANT
SHEATH BRITE TIP 6FR 35CM (SHEATH) ×4 IMPLANT
SHEATH PINNACLE 6F 10CM (SHEATH) ×4 IMPLANT
SHEATH PINNACLE 8F 10CM (SHEATH) ×4 IMPLANT
SLEEVE REPOSITIONING LENGTH 30 (MISCELLANEOUS) ×4 IMPLANT
SPONGE LAP 4X18 RFD (DISPOSABLE) IMPLANT
STOPCOCK MORSE 400PSI 3WAY (MISCELLANEOUS) ×8 IMPLANT
SUT ETHIBOND X763 2 0 SH 1 (SUTURE) IMPLANT
SUT GORETEX CV 4 TH 22 36 (SUTURE) IMPLANT
SUT GORETEX CV4 TH-18 (SUTURE) IMPLANT
SUT MNCRL AB 3-0 PS2 18 (SUTURE) IMPLANT
SUT PROLENE 5 0 C 1 36 (SUTURE) IMPLANT
SUT PROLENE 6 0 C 1 30 (SUTURE) IMPLANT
SUT SILK  1 MH (SUTURE) ×2
SUT SILK 1 MH (SUTURE) ×2 IMPLANT
SUT VIC AB 2-0 CT1 27 (SUTURE)
SUT VIC AB 2-0 CT1 TAPERPNT 27 (SUTURE) IMPLANT
SUT VIC AB 2-0 CTX 36 (SUTURE) IMPLANT
SUT VIC AB 3-0 SH 8-18 (SUTURE) IMPLANT
SYR 50ML LL SCALE MARK (SYRINGE) ×4 IMPLANT
SYR BULB IRRIGATION 50ML (SYRINGE) IMPLANT
SYR CONTROL 10ML LL (SYRINGE) IMPLANT
TAPE CLOTH SURG 4X10 WHT LF (GAUZE/BANDAGES/DRESSINGS) ×3 IMPLANT
TOWEL GREEN STERILE (TOWEL DISPOSABLE) ×8 IMPLANT
TRANSDUCER W/STOPCOCK (MISCELLANEOUS) ×8 IMPLANT
TRAY FOLEY SLVR 16FR TEMP STAT (SET/KITS/TRAYS/PACK) IMPLANT
TUBING HIGH PRESSURE 120CM (CONNECTOR) ×3 IMPLANT
VALVE HEART TRANSCATH SZ3 29MM (Prosthesis & Implant Heart) ×3 IMPLANT
WIRE .035 3MM-J 145CM (WIRE) ×4 IMPLANT

## 2017-11-17 NOTE — Progress Notes (Signed)
Patient interviewed by Allayne Gitelman, RN.

## 2017-11-17 NOTE — Progress Notes (Signed)
  Echocardiogram 2D Echocardiogram has been performed.  Eric Perry M 11/17/2017, 12:27 PM

## 2017-11-17 NOTE — Anesthesia Procedure Notes (Signed)
Central Venous Catheter Insertion Performed by: Annye Asa, MD, anesthesiologist Start/End10/09/2017 9:33 AM, 11/17/2017 9:45 AM Patient location: Pre-op. Preanesthetic checklist: patient identified, IV checked, risks and benefits discussed, surgical consent, monitors and equipment checked, pre-op evaluation, timeout performed and anesthesia consent Position: supine Lidocaine 1% used for infiltration and patient sedated Hand hygiene performed , maximum sterile barriers used  and Seldinger technique used Catheter size: 8 Fr Double lumen Procedure performed using ultrasound guided technique. Ultrasound Notes:anatomy identified, needle tip was noted to be adjacent to the nerve/plexus identified, no ultrasound evidence of intravascular and/or intraneural injection and image(s) printed for medical record Attempts: 1 Following insertion, line sutured, dressing applied and Biopatch. Post procedure assessment: blood return through all ports, free fluid flow and no air  Patient tolerated the procedure well with no immediate complications. Additional procedure comments: CVP: Timeout, sterile prep, drape, FBP R neck.  Supine position.  1% lido local, finder and trocar RIJ 1st pass with US guidance.  2 lumen placed over J wire. Biopatch and sterile dressing on.  Patient tolerated well.  VSS.  Jenita Seashore, MD.

## 2017-11-17 NOTE — Anesthesia Procedure Notes (Signed)
Arterial Line Insertion Start/End10/09/2017 9:15 AM, 11/17/2017 9:30 AM Performed by: Carney Living, CRNA, CRNA  Patient location: Pre-op. Preanesthetic checklist: patient identified, IV checked, risks and benefits discussed, pre-op evaluation and timeout performed Right, radial was placed Catheter size: 20 G Hand hygiene performed  and maximum sterile barriers used   Attempts: 1 Procedure performed without using ultrasound guided technique. Following insertion, dressing applied and Biopatch. Post procedure assessment: normal and unchanged  Patient tolerated the procedure well with no immediate complications.

## 2017-11-17 NOTE — Op Note (Signed)
[facility]   MULTIDISCIPLINARY HEART VALVE TEAM   TAVR OPERATIVE NOTE   Date of Procedure:  11/17/2017  Preoperative Diagnosis: Severe Aortic Stenosis   Postoperative Diagnosis: Same   Procedure:    Transcatheter Aortic Valve Replacement - Percutaneous Left Transfemoral Approach  Edwards Sapien 3 THV (size 29 mm, model # 9600TFX, serial # [id number])   Co-Surgeons:  Gaye Pollack, MD and Lauree Chandler, MD   Anesthesiologist:  Annye Asa, MD  Echocardiographer:  Jenkins Rouge, MD  Pre-operative Echo Findings:  Severe aortic stenosis  Normal left ventricular systolic function  Post-operative Echo Findings:  No paravalvular leak  Normal left ventricular systolic function   BRIEF CLINICAL NOTE AND INDICATIONS FOR SURGERY  The patient is a 77 year old gentleman with a history of HTN, hyperlipidemia, DM on no meds, chronic atrial fibrillation on Eliquis, aortic stenosis and coronary artery disease who has been followed at the [facility] in [place]. He reportedly had a coronary stent in 2001. He had an echo in October 2018 showing severe AS with a mean gradient of 47 mm Hg and an AVA of 0.9 cm2 with mild AI. He had a cath in November 2018 that by the report showed a totally occluded RCA with collaterals from the left and right and a 40% OM1. He says that he was asymptomatic so the plan was for close observation. He was seen by cardiology at the [facility] in April 2019 and according to their note was asymptomatic and the plan was for continued follow up but he doesn't think another echo was done. He was sent to Dr. Geraldo Pitter in July by his PCP. An echo on 09/25/2017 showed a trileaflet aortic valve with severely thickened and moderately calcified leaflets with a mean gradient of 38-42 mm Hg and an AVA of 0.84-0.91. DI was 0.31. There was mild AI. There was mild MR with a severely dilated LA. The mitral valve was calcified with a mean gradient of 5 mm Hg and a peak of 15  mm Hg. LVEF was 55-60% with moderate LVH.  His cardiac catheterization shows mild nonobstructive disease in the distal LAD. The mid LAD is stent with minimal restenosis. The left circumflex marginal branch has mild to moderate nonobstructive disease. The right coronary artery is a nondominant vessel with 100% chronic occlusion proximally. The distal vessel fills by collaterals. The mean gradient by cardiac catheterization was 20.3 with a peak to peak gradient of 26 mmHg. By echo the gradient was 42 mmHg and the valve appears severely calcified and stenosed. His gated cardiac CTA shows an annular dimension of 707 mm. They report from [place] measured 676 mm.I think this is probably suitable for a 29 mm Sapien 3 valve.His abdominal and pelvic CTA shows adequate pelvic vasculature for transfemoral insertion. 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 An ultrasound of the right groin confirms a right femoral AV fistula that appears to come off around the level of the PFA. We will take this into account and use the left femoral artery for valve insertion and the left femoral vein for the pacer. The pigtail can be inserted via the left radial artery or else we will have to insert higher up on the right CFA. His CT scans also had some other findings including a 7 mm sub-solid nodule in the posterior aspect of the right upper lobe that will require follow-up with CT  scan in 3 to 6 months. 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 This will also require continued follow-up with a CT scan in about 1 year. He also has a 3.7 cm infrarenal abdominal aortic aneurysm that will require further follow-up. I reviewed the CTA images with the patient and his wife and daughter and answered all their questions.  The patientand his family werecounseled at length regarding  treatment alternatives for management of severe symptomatic aortic stenosis. The risks and benefits of surgical intervention has been discussed in detail. Long-term prognosis with medical therapy was discussed. Alternative approaches such as conventional surgical aortic valve replacement, transcatheter aortic valve replacement, and palliative medical therapy were compared and contrasted at length. This discussion was placed in the context of the patient's own specific clinical presentation and past medical history. All of their questions havebeen addressed.   Following the decision to proceed with transcatheter aortic valve replacement, a discussion was held regarding what types of management strategies would be attempted intraoperatively in the event of life-threatening complications, including whether or not the patient would be considered a candidate for the use of cardiopulmonary  The patient has been advised of a variety of complications that might develop including but not limited to risks of death, stroke, paravalvular leak, aortic dissection or other major vascular complications, aortic annulus rupture, device embolization, cardiac rupture or perforation, mitral regurgitation, acute myocardial infarction, arrhythmia, heart block or bradycardia requiring permanent pacemaker placement, congestive heart failure, respiratory failure, renal failure, pneumonia, infection, other late complications related to structural valve deterioration or migration, or other complications that might ultimately cause a temporary or permanent loss of functional independence or other long term morbidity. The patient provides full informed consent for the procedure as described and all questions were answered.    DETAILS OF THE OPERATIVE PROCEDURE  PREPARATION:    The patient is brought to the operating room on the above mentioned date and central monitoring was established by the anesthesia team including placement  of a central venous line and radial arterial line. The patient is placed in the supine position on the operating table.  Intravenous antibiotics are administered. The patient is monitored closely throughout the procedure under conscious sedation.  Baseline transthoracic echocardiogram was performed. The patient's chest, abdomen, both groins, and both lower extremities are prepared and draped in a sterile manner. A time out procedure is performed.   PERIPHERAL ACCESS:    Using the modified Seldinger technique a 6 French sheath was placed into the left radial artery. A 6 French sheath was placed in the left femoral vein.   A pigtail diagnostic catheter was passed through the radial arterial sheath under fluoroscopic guidance into the aortic root.  A temporary transvenous pacemaker catheter was passed through the left femoral venous sheath under fluoroscopic guidance into the right ventricle.  The pacemaker was tested to ensure stable lead placement and pacemaker capture. Aortic root angiography was performed in order to determine the optimal angiographic angle for valve deployment.   TRANSFEMORAL ACCESS:   Percutaneous transfemoral access and sheath placement was performed  using ultrasound guidance.  The left common femoral artery was cannulated using a micropuncture needle and appropriate location was verified using hand injection angiogram.  A pair of Abbott Perclose percutaneous closure devices were placed and a 6 French sheath replaced into the femoral artery.  The patient was heparinized systemically and ACT verified > 250 seconds.    A 16 Fr transfemoral E-sheath was introduced into  the left femoral artery after progressively dilating over an Amplatz superstiff wire. An AL-2 catheter was used to direct a straight-tip exchange length wire across the native aortic valve into the left ventricle. This was exchanged out for a pigtail catheter and position was confirmed in the LV apex. Simultaneous LV and  Ao pressures were recorded.  The pigtail catheter was exchanged for an Amplatz Extra-stiff wire in the LV apex.     BALLOON AORTIC VALVULOPLASTY:   Not performed  TRANSCATHETER HEART VALVE DEPLOYMENT:   An Edwards Sapien 3 transcatheter heart valve (size 29 mm, model #9600TFX, serial #[id number]) was prepared and crimped per manufacturer's guidelines, and the proper orientation of the valve is confirmed on the [employer] delivery system. The valve was advanced through the introducer sheath using normal technique until in an appropriate position in the abdominal aorta beyond the sheath tip. The balloon was then retracted and using the fine-tuning wheel was centered on the valve. The valve was then advanced across the aortic arch using appropriate flexion of the catheter. The valve was carefully positioned across the aortic valve annulus. The Commander catheter was retracted using normal technique. Once final position of the valve has been confirmed by angiographic assessment, the valve is deployed while temporarily holding ventilation and during rapid ventricular pacing to maintain systolic blood pressure < 50 mmHg and pulse pressure < 10 mmHg. The balloon inflation is held for >3 seconds after reaching full deployment volume. Once the balloon has fully deflated the balloon is retracted into the ascending aorta and valve function is assessed using echocardiography. There is felt to be no paravalvular leak and no central aortic insufficiency.  The patient's hemodynamic recovery following valve deployment is good.  The deployment balloon and guidewire are both removed.    PROCEDURE COMPLETION:   The sheath was removed and femoral artery closure performed using the previously placed Perclose devices.   Protamine was administered once femoral arterial repair was complete. The temporary pacemaker, pigtail catheters and femoral sheaths were removed with manual pressure used for hemostasis.   The patient  tolerated the procedure well and is transported to the surgical intensive care in stable condition. There were no immediate intraoperative complications. All sponge instrument and needle counts are verified correct at completion of the operation.   No blood products were administered during the operation.  The patient received a total of 135.5 mL of intravenous contrast during the procedure.   Gaye Pollack, MD 11/17/2017 1:28 PM

## 2017-11-17 NOTE — Progress Notes (Signed)
Patient up to chair x1 this evening x1 assist. Patient assisted to use urinal patient also with one incontinent episode. Left groin level 0 left radial with out bleeding at this time will monitor patient. Susumu Hackler, Bettina Gavia rN

## 2017-11-17 NOTE — Interval H&P Note (Signed)
History and Physical Interval Note:  11/17/2017 1:12 PM  [name]  has presented today for surgery, with the diagnosis of Severe Aortic Stenosis  The various methods of treatment have been discussed with the patient and family. After consideration of risks, benefits and other options for treatment, the patient has consented to  Procedure(s): TRANSCATHETER AORTIC VALVE REPLACEMENT, TRANSFEMORAL. 50mm Edwards SAPIEN 3 THV. (N/A) INTRAOPERATIVE TRANSTHORACIC ECHOCARDIOGRAM (N/A) as a surgical intervention .  The patient's history has been reviewed, patient examined, no change in status, stable for surgery.  I have reviewed the patient's chart and labs.  Questions were answered to the patient's satisfaction.     Shortness of breath: Yes.   If yes: with what activity?: any activity Worse than previously noted?: Yes.    New edema, PND, orthopnea: No.  Recent decrease in activity i.e. more difficulty walking to mailbox, climbing stairs, etc: Yes.    Changes in sleeping i.e. need to utilize to sleep on more pillows, sitting up, etc: No.  Changes since last seen in pre-op visit: Yes.   BNP 206 consistent with acute on chronic diastolic congestive heart failure  Eric Perry

## 2017-11-17 NOTE — Progress Notes (Signed)
Patient arrived from cath lab. Patient with left groin with skin glue level 0 patient with Left radial band per report 14cc of air in band. Vital signs taken and telemetry placed call bell with in reach. Will monitor patient. Sennie Borden, Bettina Gavia RN

## 2017-11-17 NOTE — Progress Notes (Signed)
@  2030 Paged Cards on-call regarding pt's SBPs (160s-170s bilaterally). Page returned, and provider on-call endorsed no PRN necessary at this time but to re-page if pressure increased further. Will continue to monitor and assess

## 2017-11-17 NOTE — Progress Notes (Signed)
  [place] VALVE TEAM  Patient doing well s/p TAVR. She is hemodynamically stable. Groin sites stable. ECG with slow afib but no high grade block. HR in 30-50s, with up to 2.5 second pauses. Arterial line discontinued and transferred  to 4E. Plan for early ambulation after bedrest completed and hopeful discharge over the next 24-48 hours if no conduction disturbance.   Angelena Form PA-C  [facility]  Pager [phone]

## 2017-11-17 NOTE — Discharge Instructions (Signed)

## 2017-11-17 NOTE — Interval H&P Note (Signed)
History and Physical Interval Note:  11/17/2017 9:45 AM  [name]  has presented today for surgery, with the diagnosis of Severe Aortic Stenosis  The various methods of treatment have been discussed with the patient and family. After consideration of risks, benefits and other options for treatment, the patient has consented to  Procedure(s): TRANSCATHETER AORTIC VALVE REPLACEMENT, TRANSFEMORAL (N/A) INTRAOPERATIVE TRANSTHORACIC ECHOCARDIOGRAM (N/A) as a surgical intervention .  The patient's history has been reviewed, patient examined, no change in status, stable for surgery.  I have reviewed the patient's chart and labs.  Questions were answered to the patient's satisfaction.     Eric Perry

## 2017-11-17 NOTE — Transfer of Care (Signed)
Immediate Anesthesia Transfer of Care Note  Patient: Eric Perry  Procedure(s) Performed: TRANSCATHETER AORTIC VALVE REPLACEMENT, TRANSFEMORAL. 1m Edwards SAPIEN 3 THV. (N/A Chest) INTRAOPERATIVE TRANSTHORACIC ECHOCARDIOGRAM (N/A Chest)  Patient Location: Cath Lab  Anesthesia Type:MAC  Level of Consciousness: sedated and drowsy  Airway & Oxygen Therapy: Patient Spontanous Breathing and Patient connected to nasal cannula oxygen  Post-op Assessment: Report given to RN and Post -op Vital signs reviewed and stable  Post vital signs: Reviewed and stable  Last Vitals:  Vitals Value Taken Time  BP    Temp    Pulse 44 11/17/2017 12:59 PM  Resp 19 11/17/2017 12:59 PM  SpO2 93 % 11/17/2017 12:59 PM  Vitals shown include unvalidated device data.  Last Pain:  Vitals:   11/17/17 0829  TempSrc: Oral  PainSc: 7       Patients Stated Pain Goal: 2 [id number]  Complications: No apparent anesthesia complications
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
## 2017-11-18 ENCOUNTER — Inpatient Hospital Stay (HOSPITAL_COMMUNITY): Payer: Medicare HMO

## 2017-11-18 ENCOUNTER — Other Ambulatory Visit: Payer: Self-pay | Admitting: Physician Assistant

## 2017-11-18 ENCOUNTER — Encounter (HOSPITAL_COMMUNITY): Payer: Self-pay | Admitting: Cardiovascular Disease

## 2017-11-18 ENCOUNTER — Encounter: Payer: Self-pay | Admitting: Thoracic Surgery (Cardiothoracic Vascular Surgery)

## 2017-11-18 DIAGNOSIS — I350 Nonrheumatic aortic (valve) stenosis: Secondary | ICD-10-CM

## 2017-11-18 DIAGNOSIS — C61 Malignant neoplasm of prostate: Secondary | ICD-10-CM | POA: Diagnosis present

## 2017-11-18 DIAGNOSIS — I770 Arteriovenous fistula, acquired: Secondary | ICD-10-CM

## 2017-11-18 DIAGNOSIS — I351 Nonrheumatic aortic (valve) insufficiency: Secondary | ICD-10-CM

## 2017-11-18 DIAGNOSIS — E785 Hyperlipidemia, unspecified: Secondary | ICD-10-CM | POA: Diagnosis present

## 2017-11-18 LAB — ECHOCARDIOGRAM COMPLETE
Height: 70.5 in
Weight: 3485.03 oz

## 2017-11-18 LAB — CBC
HEMATOCRIT: 40 % (ref 39.0–52.0)
HEMOGLOBIN: 12.5 g/dL — AB (ref 13.0–17.0)
MCH: 26.9 pg (ref 26.0–34.0)
MCHC: 31.3 g/dL (ref 30.0–36.0)
MCV: 86 fL (ref 80.0–100.0)
Platelets: 124 10*3/uL — ABNORMAL LOW (ref 150–400)
RBC: 4.65 MIL/uL (ref 4.22–5.81)
RDW: 14.8 % (ref 11.5–15.5)
WBC: 8.6 10*3/uL (ref 4.0–10.5)
nRBC: 0 % (ref 0.0–0.2)

## 2017-11-18 LAB — BASIC METABOLIC PANEL
ANION GAP: 6 (ref 5–15)
BUN: 12 mg/dL (ref 8–23)
CHLORIDE: 104 mmol/L (ref 98–111)
CO2: 26 mmol/L (ref 22–32)
CREATININE: 0.62 mg/dL (ref 0.61–1.24)
Calcium: 8.7 mg/dL — ABNORMAL LOW (ref 8.9–10.3)
GFR calc non Af Amer: >60 mL/min (ref >60)
GLUCOSE: 114 mg/dL — AB (ref 70–99)
Potassium: 3.8 mmol/L (ref 3.5–5.1)
Sodium: 136 mmol/L (ref 135–145)

## 2017-11-18 LAB — MAGNESIUM: Magnesium: 1.7 mg/dL (ref 1.7–2.4)

## 2017-11-18 MED ORDER — SALINE SPRAY 0.65 % NA SOLN
1.0000 | NASAL | Status: DC | PRN
  Administered 2017-11-18: 1 via NASAL
  Filled 2017-11-18: qty 44

## 2017-11-18 MED ORDER — LOSARTAN POTASSIUM 25 MG PO TABS: 25.0000 mg | ORAL_TABLET | Freq: Every day | ORAL | 11 refills | Status: DC

## 2017-11-18 MED FILL — Potassium Chloride Inj 2 mEq/ML: INTRAVENOUS | Qty: 40 | Status: AC

## 2017-11-18 MED FILL — Heparin Sodium (Porcine) Inj 1000 Unit/ML: INTRAMUSCULAR | Qty: 30 | Status: AC

## 2017-11-18 MED FILL — Magnesium Sulfate Inj 50%: INTRAMUSCULAR | Qty: 10 | Status: AC

## 2017-11-18 NOTE — Progress Notes (Signed)
D/c instructions given to pt and family, including incision care, lifting restrictions and diet. Medication changed reviewed. Central line removed, clean and intact. Telemetry removed. Wife and daughter to escort home.Clyde Canterbury, RN

## 2017-11-18 NOTE — Care Management Note (Signed)
Case Management Note Marvetta Gibbons RN, BSN Transitions of Care Unit 4E- RN Case Manager [phone]  Patient Details  Name: KIANO TERRIEN MRN: [id number] Date of Birth: May 22, 1940  Subjective/Objective:  Pt admitted s/p TAVR                   Action/Plan: PTA pt lived at home with spouse- plan to return home with spouse, no CM needs noted for transition home. Pt has cane and walker at home.   Expected Discharge Date:  11/18/17               Expected Discharge Plan:  Home/Self Care  In-House Referral:  NA  Discharge planning Services  CM Consult  Post Acute Care Choice:  NA Choice offered to:  NA  DME Arranged:    DME Agency:     HH Arranged:    HH Agency:     Status of Service:  Completed, signed off  If discussed at [place] of Stay Meetings, dates discussed:    Discharge Disposition: home/self care   Additional Comments:  Dawayne Patricia, RN 11/18/2017, 11:48 AM

## 2017-11-18 NOTE — Discharge Summary (Addendum)
[place] VALVE TEAM  Discharge Summary    Patient ID: Eric Perry MRN: [id number]; DOB: January 23, 1941  Admit date: 11/17/2017 Discharge date: 11/18/2017  Primary Care Provider: Greig Right, MD  Primary Cardiologist: Dr. Geraldo Pitter / Dr. Angelena Form & Dr. Cyndia Bent (TAVR)  Discharge Diagnoses    Principal Problem:   S/P TAVR (transcatheter aortic valve replacement) Active Problems:   Osteoarthritis   CAD S/P percutaneous coronary angioplasty   Severe aortic stenosis   Atrial fibrillation, chronic   Hyperlipidemia   Prostate cancer (HCC)   Allergies Allergies  Allergen Reactions  . Rocephin [Ceftriaxone Sodium In Dextrose] Anaphylaxis and Shortness Of Breath  . Versed [Midazolam] Shortness Of Breath    Diagnostic Studies/Procedures     [facility]  TAVR OPERATIVE NOTE   Date of Procedure:                11/17/2017  Preoperative Diagnosis:      Severe Aortic Stenosis  Procedure:        Transcatheter Aortic Valve Replacement - Transfemoral Approach             Edwards Sapien 3 THV (size 29 mm, model # [id number], serial # [id number])              Co-Surgeons:                        Lauree Chandler, MD and Gaye Pollack, MD   Pre-operative Echo Findings: ? Severe aortic stenosis ? Normal left ventricular systolic function  Post-operative Echo Findings: ? No paravalvular leak ? Normal left ventricular systolic function _____________   Echo 11/18/16 Study Conclusions - Left ventricle: The cavity size was normal. There was severe   focal basal hypertrophy. Systolic function was normal. Wall   motion was normal; there were no regional wall motion   abnormalities. - Aortic valve: S/P 30mm Edwards Sapien AV bioprosthesis with   moderate perivalvular leakt by pressure half time and mild to   moderate visually. Mean gradient (S): 18 mm Hg. Valve area (VTI):   2.06 cm^2. Valve area (Vmax): 1.94 cm^2.  Valve area (Vmean): 2.27   cm^2. Regurgitation pressure half-time: 277 ms. - Aorta: Aortic root dimension: 40 mm (ED). - Aortic root: The aortic root was mildly dilated. - Mitral valve: Severely calcified annulus. There was trivial   regurgitation. Valve area by continuity equation (using LVOT   flow): 3.16 cm^2.   History of Present Illness     Eric Perry is a 77 y.o. male with a history of HLD, DM on no meds, chronic atrial fibrillation on Eliquis, CAD s/p remote PCI to LAD and CTO RCA and severe AS who presented to [facility] on 11/17/17 for planned TAVR.    He has been followed by the [place] in [place]. He reportedly had a coronary stent in 2001. He had an echo in October 2018 showing severe AS with a mean gradient of 47 mm Hg and an AVA of 0.9 cm2 with mild AI. He had a cath in November 2018 that by the report showed a totally occluded RCA with collaterals from the left and right and a 40% OM1. He says that he was asymptomatic so the plan was for close observation. He was sent to Dr. Geraldo Pitter in July by his PCP. An echo on 09/25/2017 showed a trileaflet aortic valve with severely thickened and moderately calcified leaflets with a mean gradient of 38-42  mm Hg and an AVA of 0.84-0.91. DI was 0.31. There was mild AI. There was mild MR with a severely dilated LA. The mitral valve was calcified with a mean gradient of 5 mm Hg and a peak of 15 mm Hg. LVEF was 55-60% with moderate LVH. He has been referred for consideration of AVR.   The patient is fairly sedentary due to orthopedic problems with bilateral knee replacements, left shoulder surgery and a back injury. He did not complain of any shortness of breath or fatigue but his wife and daughter report that he does get short of breath with activity and is more fatigued than he was a year ago, naps a lot.   He was referred to the multidisciplinary valve team who felt he had severe, symptomatic AS with NYHA class II symptoms and a reasonable candidate for  TAVR, which was set up for 11/17/17.    Hospital Course     Consultants: none  Severe AS: s/p successful TAVR with a 29 mm Edwards Sapien 3 THV via the TF approach on 11/17/17. Post operative echo showed a mean gradient of 55mmHg and mod PVL. Plan will be to discharge home today with repeat echo at 1 month. If echo does not show improvement we can plan on doing a TEE to better visualize the valve. Groin sites are stable. ECG with chronic afib and no high grade heart block. He will be discharged home today on Aspirin 81mg  daily and home Eliquis, to start tonight.   HTN: no history of HTN, but BP running high here. Will add Losartan 25mg  daily and follow as an outpatient with labs.  Chronic afib: rate well controlled. Resume home Eliquis at discharge.   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 His right groin was not used during our TAVR procedure. I have placed a referral with [facility]  Incidental findings: a number of incidental findings were found during pre op work up. These are listed below.   R common femoral/iliac AV fistula: outpatient appointment with [facility] will be arranged (referral placed and office will call patient with an appointment).   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 I will have this set up in the outpatient setting.  Subsolid 7 mm posterior right upper lobe pulmonary nodule: non-contrast CT recommended at 3-6 months to confirm persistence. I will have this set up in the outpatient setting.  _____________  Discharge Vitals Blood pressure (!) 141/63, pulse 78, temperature 99.3 F (37.4 C), temperature source Oral, resp. rate 18, height 5' 10.5" (1.791 m), weight 98.8 kg, SpO2 95 %.  Filed Weights   11/17/17  0829 11/18/17 0500  Weight: 93.7 kg 98.8 kg   VS:  BP (!) 141/63 (BP Location: Right Arm)   Pulse 78   Temp 99.3 F (37.4 C) (Oral)   Resp 18   Ht 5' 10.5" (1.791 m)   Wt 98.8 kg   SpO2 95%   BMI 30.81 kg/m    GEN: Well nourished, well developed, in no acute distress HEENT: normal Neck: no JVD or masses Cardiac: irrreg irreg; soft flow murmur. no rubs, or gallops,no edema  Respiratory:  clear to auscultation bilaterally, normal work of breathing GI: soft, nontender, nondistended, + BS MS: no deformity or atrophy Skin: warm and  dry, no rash Neuro:  Alert and Oriented x 3, Strength and sensation are intact Psych: euthymic mood, full affect     Labs & Radiologic Studies    CBC Recent Labs    11/17/17 1302 11/18/17 0356  WBC  --  8.6  HGB 12.6* 12.5*  HCT 37.0* 40.0  MCV  --  86.0  PLT  --  034*   Basic Metabolic Panel Recent Labs    11/17/17 1302 11/18/17 0356  NA 140 136  K 4.4 3.8  CL 101 104  CO2  --  26  GLUCOSE 162* 114*  BUN 14 12  CREATININE 0.60* 0.62  CALCIUM  --  8.7*  MG  --  1.7   Liver Function Tests No results for input(s): AST, ALT, ALKPHOS, BILITOT, PROT, ALBUMIN in the last 72 hours. No results for input(s): LIPASE, AMYLASE in the last 72 hours. Cardiac Enzymes No results for input(s): CKTOTAL, CKMB, CKMBINDEX, TROPONINI in the last 72 hours. BNP Invalid input(s): POCBNP D-Dimer No results for input(s): DDIMER in the last 72 hours. Hemoglobin A1C No results for input(s): HGBA1C in the last 72 hours. Fasting Lipid Panel No results for input(s): CHOL, HDL, LDLCALC, TRIG, CHOLHDL, LDLDIRECT in the last 72 hours. Thyroid Function Tests No results for input(s): TSH, T4TOTAL, T3FREE, THYROIDAB in the last 72 hours.  Invalid input(s): FREET3 _____________  Dg Chest 2 View  Result Date: 11/13/2017 CLINICAL DATA:  Severe aortic stenosis, preoperative EXAM: CHEST - 2 VIEW COMPARISON:  12/03/2013 chest radiograph. FINDINGS: Stable  cardiomediastinal silhouette with mild cardiomegaly. No pneumothorax. No pleural effusion. No pulmonary edema. No acute consolidative airspace disease. Stable mild elevation of the left hemidiaphragm. IMPRESSION: Stable cardiomegaly without overt pulmonary edema. No active pulmonary disease. Electronically Signed   By: Ilona Sorrel M.D.   On: 11/13/2017 16:57   Ct Coronary Morph W/cta Cor W/score W/ca W/cm &/or Wo/cm  Addendum Date: 11/09/2017   ADDENDUM REPORT: 11/09/2017 16:45 EXAM: OVER-READ INTERPRETATION  CT CHEST The following report is an over-read performed by radiologist Dr. Samara Snide [facility] on 11/09/2017. This over-read does not include interpretation of cardiac or coronary anatomy or pathology. The coronary CTA interpretation by the cardiologist is attached. COMPARISON:  12/03/2013 chest radiograph. FINDINGS: Please see the separate concurrent chest CT angiogram report for details. IMPRESSION: Please see the separate concurrent chest CT angiogram report for details. Electronically Signed   By: Ilona Sorrel M.D.   On: 11/09/2017 16:45   Result Date: 11/09/2017 CLINICAL DATA:  Aortic Stenosis EXAM: Cardiac TAVR CT TECHNIQUE: The patient was scanned on a Siemens Force 742 slice scanner. A 120 kV retrospective scan was triggered in the ascending thoracic aorta at 140 HU's. Gantry rotation speed was 250 msecs and collimation was .6 mm. No beta blockade or nitro were given. The 3D data set was reconstructed in 5% intervals of the R-R cycle. Systolic and diastolic phases were analyzed on a dedicated work station using MPR, MIP and VRT modes. The patient received 80 cc of contrast. FINDINGS: Aortic Valve: Tri leaflet, calcified with restricted leaflet motion Aorta: Moderate calcific atherosclerotic debris with bovine arch Sino-tubular Junction: 32 mm Ascending Thoracic Aorta: 41 mm Aortic Arch: 34 mm Descending Thoracic Aorta: 36 mm Sinus of Valsalva Measurements: Non-coronary: 39 mm  Right - coronary: 39 mm Left -   coronary: 39 mm Coronary Artery Height above Annulus: Left Main: 12.6 mm above annulus Right Coronary: 21 mm above annulus Virtual Basal Annulus Measurements: Maximum / Minimum  Diameter: 32.4 mm x 25.9 mm Perimeter: 98.4 mm Area: 707 mm2 Coronary Arteries: Sufficient height above annulus for deployment Optimum Fluoroscopic Angle for Delivery: RAO 7 Cranial 7 IMPRESSION: 1. Calcified tri leaflet AV with annular area of 707 mm2 and perimeter 98.4 mm Upper limit of normal for a 29 Sapien 3 is 683 mm2 and perimeter for a 34 mm Evolut R is 94.2 mm 2.  Coronary arteries sufficient height above annulus for deployment 3.  No LAA thrombus 4.  Mld aortic root enlargement 4.1 cm 5. Optimum angiographic angle for deployment RAO 7 degrees Cranial 7 degrees Will need to discuss with team if patient is suitable for TAVR [place] Electronically Signed: By: Jenkins Rouge M.D. On: 11/09/2017 15:49   Dg Chest Port 1 View  Result Date: 11/17/2017 CLINICAL DATA:  TAVR today EXAM: PORTABLE CHEST 1 VIEW COMPARISON:  11/13/2017 FINDINGS: TAVR in good position. Cardiac enlargement without heart failure or edema. Bibasilar atelectasis right greater than left. No effusion. Central line in the SVC. No pneumothorax. IMPRESSION: TAVR in good position.  Negative for heart failure Central line in the SVC Bibasilar atelectasis right greater than left Electronically Signed   By: Franchot Gallo M.D.   On: 11/17/2017 15:52   Ct Angio Chest Aorta W &/or Wo Contrast  Result Date: 11/09/2017 CLINICAL DATA:  Severe symptomatic aortic stenosis. Dyspnea on exertion. TAVR evaluation. EXAM: CT ANGIOGRAPHY CHEST, ABDOMEN AND PELVIS TECHNIQUE: Multidetector CT imaging through the chest, abdomen and pelvis was performed using the standard protocol during bolus administration of intravenous contrast. Multiplanar reconstructed images and MIPs were obtained and reviewed to evaluate the vascular anatomy. CONTRAST:  134mL  ISOVUE-370 IOPAMIDOL (ISOVUE-370) INJECTION 76% COMPARISON:  07/30/2005 abdominal sonogram. 12/03/2013 chest radiograph. FINDINGS: CTA CHEST FINDINGS Cardiovascular: Mild-to-moderate cardiomegaly. Thickened and coarsely calcified aortic valve. No significant pericardial effusion/thickening. Left main and 3 vessel coronary atherosclerosis. Atherosclerotic thoracic aorta with ectatic 4.3 cm ascending thoracic aorta. Borderline prominent main pulmonary artery (3.3 cm diameter). No central pulmonary emboli. Mediastinum/Nodes: No discrete thyroid nodules. Unremarkable esophagus. No axillary adenopathy. Mildly enlarged 1.3 cm left supraclavicular node (series 14/image 14). Right paratracheal adenopathy up to 1.4 cm (series 14/image 33). Mildly enlarged 1.3 cm subcarinal node (series 14/image 51). Mild bilateral hilar adenopathy up to 1.4 cm on the right (series 14/image 39) and 1.2 cm on the left (series 14/image 50). Lungs/Pleura: No pneumothorax. No pleural effusion. Subsolid 7 mm posterior right upper lobe pulmonary nodule (series 15/image 25). No acute consolidative airspace disease, lung masses or additional significant pulmonary nodules. Musculoskeletal: No aggressive appearing focal osseous lesions. Soft tissue anchors are noted in the left humeral head. Moderate thoracic spondylosis. CTA ABDOMEN AND PELVIS FINDINGS Hepatobiliary: Normal liver with no liver mass. Normal gallbladder with no radiopaque cholelithiasis. No biliary ductal dilatation. Pancreas: Normal, with no mass or duct dilation. Spleen: Normal size. No mass. Adrenals/Urinary Tract: Normal adrenals. No hydronephrosis. Simple 3.9 cm posterior upper right renal cyst. Subcentimeter hypodense renal cortical lesion in the interpolar left kidney is too small to characterize and requires no follow-up. Diffuse bladder wall thickening and trabeculation with numerous scattered bladder diverticula, largest 3.8 cm in the posterior right bladder wall.  Stomach/Bowel: Normal non-distended stomach. Normal caliber small bowel with no small bowel wall thickening. Appendectomy. Normal large bowel with no diverticulosis, large bowel wall thickening or pericolonic fat stranding. Vascular/Lymphatic: Atherosclerotic abdominal aorta with 3.7 cm infrarenal abdominal aortic aneurysm. Patent portal, splenic, hepatic and renal veins. Asymmetric arterial phase enhancement of the right  common femoral and right iliac veins (series 14/image 192), cannot exclude right common femoral artery venous fistula. No pathologically enlarged lymph nodes in the abdomen or pelvis. Reproductive: Mildly enlarged prostate with brachytherapy seeds surrounding the prostate. Other: No pneumoperitoneum, ascites or focal fluid collection. Musculoskeletal: No aggressive appearing focal osseous lesions. Endplate based mixed lytic and sclerotic lesions throughout the lumbar vertebrae, favor degenerative Schmorl's nodes. Moderate lumbar spondylosis. VASCULAR MEASUREMENTS PERTINENT TO TAVR: AORTA: Minimal Aortic Diameter-21.2 x 21.2 mm (abdominal aorta on series 14/image 114) Severity of Aortic Calcification-moderate, noting mild-to-moderate atherosclerotic plaque in ascending thoracic aorta at the sinotubular junction. RIGHT PELVIS: Right Common Iliac Artery - Minimal Diameter-12.9 x 12.6 mm Tortuosity-mild Calcification-moderate Right External Iliac Artery - Minimal Diameter-10.9 x 9.8 mm Tortuosity-moderate Calcification-none Right Common Femoral Artery - Minimal Diameter-10.4 x 10.0 mm Tortuosity-mild Calcification-none LEFT PELVIS: Left Common Iliac Artery - Minimal Diameter-14.1 x 13.6 mm Tortuosity-moderate Calcification-moderate Left External Iliac Artery - Minimal Diameter-9.7 x 9.1 mm Tortuosity-mild Calcification-none Left Common Femoral Artery - Minimal Diameter-10.1 x 9.8 mm Tortuosity-mild Calcification-none Review of the MIP images confirms the above findings. IMPRESSION: 1. Vascular findings  and measurements pertinent to potential TAVR procedure, as detailed above. 2. Severe thickening and calcification of the aortic valve, compatible with the reported clinical history of severe symptomatic aortic stenosis. 3. Aortic Atherosclerosis (ICD10-I70.0). 4. 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 5. Infrarenal 3.7 cm Abdominal Aortic Aneurysm (ICD10-I71.9). Recommend follow-up aortic ultrasound in 2 years. This recommendation follows ACR consensus guidelines: White Paper of the ACR Incidental Findings Committee II on Vascular Findings. J Am Coll Radiol 2013; 10:789-794. 6. 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 Recommend annual imaging followup by CTA or MRA. This recommendation follows 2010 ACCF/AHA/AATS/ACR/ASA/SCA/SCAI/SIR/STS/SVM Guidelines for the Diagnosis and Management of Patients with Thoracic Aortic Disease. Circulation. 2010; 121: D638-V564. 7. Mild-to-moderate cardiomegaly. Left main and 3 vessel coronary atherosclerosis. 8. Nonspecific mild mediastinal, bilateral hilar and left supraclavicular lymphadenopathy. These nodes can be reassessed on follow-up chest CT with IV contrast in 3-6 months. 9. Subsolid 7 mm posterior right upper lobe pulmonary nodule. Follow-up non-contrast CT recommended at 3-6 months to confirm persistence. If unchanged, and solid component remains <6 mm, annual CT is recommended until 5 years of stability has been established. If persistent these nodules should be considered highly suspicious if the solid component of the nodule is 6 mm or greater in size and enlarging. This recommendation follows the consensus statement: Guidelines for Management of Incidental Pulmonary Nodules Detected on CT Images: From the Fleischner Society 2017; Radiology 2017; 284:228-243. 10. 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 Electronically Signed   By: Ilona Sorrel M.D.   On: 11/09/2017 16:44   Ct Angio Abd/pel W/ And/or W/o  Result Date: 11/09/2017 CLINICAL DATA:  Severe symptomatic aortic stenosis. Dyspnea on exertion. TAVR evaluation. EXAM: CT ANGIOGRAPHY CHEST, ABDOMEN AND PELVIS TECHNIQUE: Multidetector CT imaging through the chest, abdomen and pelvis was performed using the standard protocol during bolus administration of intravenous contrast. Multiplanar reconstructed images and MIPs were obtained and reviewed to evaluate the vascular anatomy. CONTRAST:  134mL ISOVUE-370 IOPAMIDOL (ISOVUE-370) INJECTION 76% COMPARISON:  07/30/2005 abdominal sonogram. 12/03/2013 chest radiograph. FINDINGS: CTA CHEST FINDINGS Cardiovascular: Mild-to-moderate cardiomegaly. Thickened and coarsely calcified aortic valve. No significant pericardial effusion/thickening. Left main and 3 vessel coronary atherosclerosis. Atherosclerotic thoracic aorta with ectatic 4.3 cm  ascending thoracic aorta. Borderline prominent main pulmonary artery (3.3 cm diameter). No central pulmonary emboli. Mediastinum/Nodes: No discrete thyroid nodules. Unremarkable esophagus. No axillary adenopathy. Mildly enlarged 1.3 cm left supraclavicular node (series 14/image 14). Right paratracheal adenopathy up to 1.4 cm (series 14/image 33). Mildly enlarged 1.3 cm subcarinal node (series 14/image 51). Mild bilateral hilar adenopathy up to 1.4 cm on the right (series 14/image 39) and 1.2 cm on the left (series 14/image 50). Lungs/Pleura: No pneumothorax. No pleural effusion. Subsolid 7 mm posterior right upper lobe pulmonary nodule (series 15/image 25). No acute consolidative airspace disease, lung masses or additional significant pulmonary nodules. Musculoskeletal: No aggressive appearing focal osseous lesions. Soft tissue anchors are noted in the left humeral head. Moderate thoracic spondylosis. CTA ABDOMEN AND PELVIS  FINDINGS Hepatobiliary: Normal liver with no liver mass. Normal gallbladder with no radiopaque cholelithiasis. No biliary ductal dilatation. Pancreas: Normal, with no mass or duct dilation. Spleen: Normal size. No mass. Adrenals/Urinary Tract: Normal adrenals. No hydronephrosis. Simple 3.9 cm posterior upper right renal cyst. Subcentimeter hypodense renal cortical lesion in the interpolar left kidney is too small to characterize and requires no follow-up. Diffuse bladder wall thickening and trabeculation with numerous scattered bladder diverticula, largest 3.8 cm in the posterior right bladder wall. Stomach/Bowel: Normal non-distended stomach. Normal caliber small bowel with no small bowel wall thickening. Appendectomy. Normal large bowel with no diverticulosis, large bowel wall thickening or pericolonic fat stranding. Vascular/Lymphatic: Atherosclerotic abdominal aorta with 3.7 cm infrarenal abdominal aortic aneurysm. Patent portal, splenic, hepatic and renal veins. Asymmetric arterial phase enhancement of the right common femoral and right iliac veins (series 14/image 192), cannot exclude right common femoral artery venous fistula. No pathologically enlarged lymph nodes in the abdomen or pelvis. Reproductive: Mildly enlarged prostate with brachytherapy seeds surrounding the prostate. Other: No pneumoperitoneum, ascites or focal fluid collection. Musculoskeletal: No aggressive appearing focal osseous lesions. Endplate based mixed lytic and sclerotic lesions throughout the lumbar vertebrae, favor degenerative Schmorl's nodes. Moderate lumbar spondylosis. VASCULAR MEASUREMENTS PERTINENT TO TAVR: AORTA: Minimal Aortic Diameter-21.2 x 21.2 mm (abdominal aorta on series 14/image 114) Severity of Aortic Calcification-moderate, noting mild-to-moderate atherosclerotic plaque in ascending thoracic aorta at the sinotubular junction. RIGHT PELVIS: Right Common Iliac Artery - Minimal Diameter-12.9 x 12.6 mm Tortuosity-mild  Calcification-moderate Right External Iliac Artery - Minimal Diameter-10.9 x 9.8 mm Tortuosity-moderate Calcification-none Right Common Femoral Artery - Minimal Diameter-10.4 x 10.0 mm Tortuosity-mild Calcification-none LEFT PELVIS: Left Common Iliac Artery - Minimal Diameter-14.1 x 13.6 mm Tortuosity-moderate Calcification-moderate Left External Iliac Artery - Minimal Diameter-9.7 x 9.1 mm Tortuosity-mild Calcification-none Left Common Femoral Artery - Minimal Diameter-10.1 x 9.8 mm Tortuosity-mild Calcification-none Review of the MIP images confirms the above findings. IMPRESSION: 1. Vascular findings and measurements pertinent to potential TAVR procedure, as detailed above. 2. Severe thickening and calcification of the aortic valve, compatible with the reported clinical history of severe symptomatic aortic stenosis. 3. Aortic Atherosclerosis (ICD10-I70.0). 4. 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 5. Infrarenal 3.7 cm Abdominal Aortic Aneurysm (ICD10-I71.9). Recommend follow-up aortic ultrasound in 2 years. This recommendation follows ACR consensus guidelines: White Paper of the ACR Incidental Findings Committee II on Vascular Findings. J Am Coll Radiol 2013; 10:789-794. 6. 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 Recommend annual imaging followup by CTA or MRA. This recommendation follows 2010 ACCF/AHA/AATS/ACR/ASA/SCA/SCAI/SIR/STS/SVM Guidelines for the Diagnosis and Management of Patients with Thoracic Aortic Disease. Circulation. 2010; 121: D176-H607. 7. Mild-to-moderate cardiomegaly. Left main  and 3 vessel coronary atherosclerosis. 8. Nonspecific mild mediastinal, bilateral hilar and left supraclavicular lymphadenopathy. These nodes can be reassessed on follow-up chest CT with IV contrast in 3-6 months. 9. Subsolid 7 mm posterior right upper lobe pulmonary nodule. Follow-up non-contrast CT  recommended at 3-6 months to confirm persistence. If unchanged, and solid component remains <6 mm, annual CT is recommended until 5 years of stability has been established. If persistent these nodules should be considered highly suspicious if the solid component of the nodule is 6 mm or greater in size and enlarging. This recommendation follows the consensus statement: Guidelines for Management of Incidental Pulmonary Nodules Detected on CT Images: From the Fleischner Society 2017; Radiology 2017; 284:228-243. 10. 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 Electronically Signed   By: Ilona Sorrel M.D.   On: 11/09/2017 16:44   Disposition   Pt is being discharged home today in good condition.  Follow-up Plans & Appointments    Follow-up Information    Eileen Stanford, PA-C. Go on 11/26/2017.   Specialties:  Cardiology, Radiology Why:  @ 2:30pm, please arrive at least 10 minutes early Contact information: [place] [address] [phone]        [facility] Follow up.   Specialty:  Vascular Surgery Why:  A referral has been made for you. The office will call you to arrange and appointment with one of their doctors to follow the AV fistula in your leg.  Contact information: [address] [place] [phone]           Discharge Medications   Allergies as of 11/18/2017      Reactions   Rocephin [ceftriaxone Sodium In Dextrose] Anaphylaxis, Shortness Of Breath   Versed [midazolam] Shortness Of Breath      Medication List    STOP taking these medications   BUFFERIN EXTRA STRENGTH 500 MG Tabs Generic drug:  Aspirin Buf(CaCarb-MgCarb-MgO)   metoprolol tartrate 50 MG tablet Commonly known as:  LOPRESSOR     TAKE these medications   aspirin 81 MG tablet Take 81 mg  by mouth daily.   ELIQUIS 5 MG Tabs tablet Generic drug:  apixaban Take 5 mg by mouth 2 (two) times daily.   Fish Oil 1000 MG Caps Take 1,000 mg by mouth 2 (two) times daily.   lansoprazole 15 MG capsule Commonly known as:  PREVACID Take 15 mg by mouth daily before breakfast.   losartan 25 MG tablet Commonly known as:  COZAAR Take 1 tablet (25 mg total) by mouth daily.   LUBRICANT EYE DROPS 0.4-0.3 % Soln Generic drug:  Polyethyl Glycol-Propyl Glycol Place 1 drop into both eyes 3 (three) times daily as needed (for dry eyes).   morphine 15 MG tablet Commonly known as:  MSIR Take 15 mg by mouth 3 (three) times daily as needed for severe pain.   simvastatin 40 MG tablet Commonly known as:  ZOCOR Take 40 mg by mouth at bedtime.   tamsulosin 0.4 MG Caps capsule Commonly known as:  FLOMAX Take 0.4 mg by mouth daily.           Outstanding Labs/Studies   BMET  Duration of Discharge Encounter   Greater than 30 minutes including physician time.  Signed, Angelena Form, PA-C 11/18/2017, 11:00 AM [phone]  I have personally seen and examined this patient. I agree with the assessment and  plan as outlined above.  He is doing well post TAVR. Echo with elevated gradient and mod PVL. Will follow echo at one month. [facility] referral for right groin AV fistula. Discharge home.   Lauree Chandler 11/19/2017 5:05 AM

## 2017-11-18 NOTE — Addendum Note (Signed)
Addendum  created 11/18/17 1643 by Annye Asa, MD   Sign clinical note

## 2017-11-18 NOTE — Anesthesia Postprocedure Evaluation (Addendum)
Anesthesia Post Note  Patient: Eric Perry  Procedure(s) Performed: TRANSCATHETER AORTIC VALVE REPLACEMENT, TRANSFEMORAL. 44m Edwards SAPIEN 3 THV. (N/A Chest) INTRAOPERATIVE TRANSTHORACIC ECHOCARDIOGRAM (N/A Chest)     Anesthesia Type: MAC Pain management: pain level controlled Vital Signs Assessment: post-procedure vital signs reviewed and stable Respiratory status: spontaneous breathing, nonlabored ventilation and respiratory function stable Cardiovascular status: blood pressure returned to baseline and stable Postop Assessment: no apparent nausea or vomiting, adequate PO intake and able to ambulate Anesthetic complications: no Comments: Pt discharged prior to post op visit, no reported anesthetic complications    Last Vitals:  Vitals:   11/18/17 0808 11/18/17 1133  BP: (!) 141/63 116/64  Pulse: 78   Resp: 18 13  Temp: 37.4 C 37 C  SpO2: 95% 96%    Last Pain:  Vitals:   11/18/17 1133  TempSrc: Oral  PainSc:                  Savion Washam,E. Dilia Alemany

## 2017-11-18 NOTE — Progress Notes (Signed)
CARDIAC REHAB PHASE I   Offered to walk with pt, pt declining stating he has been up walking in the hallway already. Pt denies SOB or CP. Pt educated on incision care. Reviewed restrictions and exercise guidelines. Pt given heart healthy diet. Pt declining CRP II at this time. Pt anxious for d/c. RN aware.  [id number] Rufina Falco, RN BSN 11/18/2017 11:19 AM

## 2017-11-18 NOTE — Progress Notes (Signed)
  Echocardiogram 2D Echocardiogram has been performed.  Roseanna Rainbow R 11/18/2017, 9:02 AM

## 2017-11-20 NOTE — Consult Note (Signed)
            [facility] CM Primary Care Navigator  11/20/2017  Eric Perry 1940-12-20 [id number]   Attempt to seepatient at the bedside to identify possible discharge needsbut he was alreadydischargedhomeper staff.  Per MD note,patientpresented for planned TAVR- transcatheter aortic valve replacement related to severe, symptomatic aortic stenosis.  Per chart review, patient has been followed by the [place] in [place] (Dr. Burnett Sheng -primary care provider) and has referred patient to cardiology (Dr. Geraldo Pitter).   Patient has discharge instruction to follow-up withcardiology on 11/26/17 and vascular surgery follow-up- (office will call patient for appointment schedule).   For additional questions please contact:  Edwena Felty A. Jerra Huckeby, BSN, RN-BC [facility] PRIMARY CARE Navigator Cell: [phone]

## 2017-11-25 NOTE — Progress Notes (Signed)
[facility] [place]                                       Cardiology Office Note    Date:  11/26/2017   ID:  Eric Perry, DOB 12-07-1940, MRN [id number]  PCP:  Greig Right, MD  Cardiologist: Dr. Geraldo Pitter / Dr. Angelena Form & Dr. Cyndia Bent (TAVR)  CC: [facility] s/p TAVR  History of Present Illness:  Eric Perry is a 77 y.o. male with a history of HLD, DM on no meds, chronic atrial fibrillation on Eliquis, CAD s/p remote PCI to LAD and CTO RCA and severe AS s/p TAVR (11/17/17) who presents to clinic for follow up.   [facility] [place]. He reportedly had a coronary stent in 2001. He had an echo in October 2018 showing severe AS with a mean gradient of 47 mm Hg and an AVA of 0.9 cm2 with mild AI. He had a cath in November 2018 that by the report showed a totally occluded RCA with collaterals from the left and right and a 40% OM1. He says that he was asymptomatic so the plan was for close observation. He was sent to Dr. Geraldo Pitter in July by his PCP. An echo on 09/25/2017 showed a trileaflet aortic valve with severely thickened and moderately calcified leaflets with a mean gradient of 38-42 mm Hg and an AVA of 0.84-0.91. DI was 0.31. There was mild AI. There was mild MR with a severely dilated LA. The mitral valve was calcified with a mean gradient of 5 mm Hg and a peak of 15 mm Hg. LVEF was 55-60% with moderate LVH.   He underwent successful TAVR with a 29 mm Edwards Sapien 3 THV via the TF approach on 11/17/17. Post operative echo showed a mean gradient of 57mmHg and mod PVL. Given elevated gradients, plan was repeat echo at 1 month per protocol and if echo does not show improvement, we will do a TEE to better visualize the valve. BP was running high during admission and Losartan 25mg  daily added.   Today he presents to clinic for follow up. He can tell a difference in his breathing. He has been walking 10-15 minutes a day with no  issues. Groin sites healing well. No CP or SOB. No LE edema, orthopnea or PND. No dizziness or syncope. No blood in stool or urine. No palpitations.     Past Medical History:  Diagnosis Date  . Atrial fibrillation, chronic    a. on Eliquis  . CAD (coronary artery disease)    a. s/p remote stenting to LAD, CTO RCA with collaterals  . Hyperlipidemia   . Osteoarthritis    "was probably in my knees" (11/17/2017)  . Prostate cancer ([place])   . S/P TAVR (transcatheter aortic valve replacement)   . Severe aortic stenosis     Past Surgical History:  Procedure Laterality Date  . APPENDECTOMY    . CORONARY ANGIOPLASTY WITH STENT PLACEMENT  1990s  . INSERTION PROSTATE RADIATION SEED  ~ 2004  . INTRAOPERATIVE TRANSTHORACIC ECHOCARDIOGRAM N/A 11/17/2017   Procedure: INTRAOPERATIVE TRANSTHORACIC ECHOCARDIOGRAM;  Surgeon: Burnell Blanks, MD;  Location: [facility];  Service: Open Heart Surgery;  Laterality: N/A;  . JOINT REPLACEMENT    . REPLACEMENT TOTAL KNEE BILATERAL Bilateral   . RIGHT/LEFT HEART CATH AND CORONARY ANGIOGRAPHY N/A 10/22/2017  Procedure: RIGHT/LEFT HEART CATH AND CORONARY ANGIOGRAPHY;  Surgeon: Burnell Blanks, MD;  Location: [facility];  Service: Cardiovascular;  Laterality: N/A;  . SHOULDER ARTHROSCOPY W/ ROTATOR CUFF REPAIR Left 2018  . TRANSCATHETER AORTIC VALVE REPLACEMENT, TRANSFEMORAL  11/17/2017  . TRANSCATHETER AORTIC VALVE REPLACEMENT, TRANSFEMORAL N/A 11/17/2017   Procedure: TRANSCATHETER AORTIC VALVE REPLACEMENT, TRANSFEMORAL. 20mm Edwards SAPIEN 3 THV.;  Surgeon: Burnell Blanks, MD;  Location: [facility];  Service: Open Heart Surgery;  Laterality: N/A;    Current Medications: Outpatient Medications Prior to Visit  Medication Sig Dispense Refill  . apixaban (ELIQUIS) 5 MG TABS tablet Take 5 mg by mouth 2 (two) times daily.    Marland Kitchen aspirin 81 MG tablet Take 81 mg by mouth daily.     . lansoprazole (PREVACID) 15 MG capsule Take 15 mg by mouth daily  before breakfast.     . losartan (COZAAR) 25 MG tablet Take 1 tablet (25 mg total) by mouth daily. 30 tablet 11  . morphine (MSIR) 15 MG tablet Take 15 mg by mouth 3 (three) times daily as needed for severe pain.     . Omega-3 Fatty Acids (FISH OIL) 1000 MG CAPS Take 1,000 mg by mouth 2 (two) times daily.     Vladimir Faster Glycol-Propyl Glycol (LUBRICANT EYE DROPS) 0.4-0.3 % SOLN Place 1 drop into both eyes 3 (three) times daily as needed (for dry eyes).     . simvastatin (ZOCOR) 40 MG tablet Take 40 mg by mouth at bedtime.     . tamsulosin (FLOMAX) 0.4 MG CAPS capsule Take 0.4 mg by mouth daily.      No facility-administered medications prior to visit.      Allergies:   Rocephin [ceftriaxone sodium in dextrose] and Versed [midazolam]   Social History   Socioeconomic History  . Marital status: Married    Spouse name: Not on file  . Number of children: 4  . Years of education: Not on file  . Highest education level: Not on file  Occupational History  . Occupation: Retired Therapist, nutritional, plant superviisor  Social Needs  . Financial resource strain: Not on file  . Food insecurity:    Worry: Not on file    Inability: Not on file  . Transportation needs:    Medical: Not on file    Non-medical: Not on file  Tobacco Use  . Smoking status: Former Smoker    Packs/day: 0.50    Years: 10.00    Pack years: 5.00    Types: Cigarettes    Last attempt to quit: 02/11/1960    Years since quitting: 57.8  . Smokeless tobacco: Never Used  Substance and Sexual Activity  . Alcohol use: Yes    Comment: 11/17/2017 "1 beer/month"  . Drug use: Never  . Sexual activity: Not Currently  Lifestyle  . Physical activity:    Days per week: Not on file    Minutes per session: Not on file  . Stress: Not on file  Relationships  . Social connections:    Talks on phone: Not on file    Gets together: Not on file    Attends religious service: Not on file    Active member of club or organization: Not on file     Attends meetings of clubs or organizations: Not on file    Relationship status: Not on file  Other Topics Concern  . Not on file  Social History Narrative  . Not on file  Family History:  The patient's family history includes Colon cancer in his mother.     ROS:   Please see the history of present illness.    ROS All other systems reviewed and are negative.   PHYSICAL EXAM:   VS:  BP (!) 146/66   Pulse 69   Ht 5' 10.5" (1.791 m)   Wt 211 lb (95.7 kg)   BMI 29.85 kg/m    GEN: Well nourished, well developed, in no acute distress HEENT: normal Neck: no JVD or masses Cardiac: irreg irreg; no murmurs, rubs, or gallops,no edema  Respiratory:  clear to auscultation bilaterally, normal work of breathing GI: soft, nontender, nondistended, + BS MS: no deformity or atrophy Skin: warm and dry, no rash Neuro:  Alert and Oriented x 3, Strength and sensation are intact Psych: euthymic mood, full affect   Wt Readings from Last 3 Encounters:  11/26/17 211 lb (95.7 kg)  11/18/17 217 lb 13 oz (98.8 kg)  11/13/17 206 lb 8 oz (93.7 kg)      Studies/Labs Reviewed:   EKG:  EKG is ordered today.  The ekg ordered today demonstrates chronic afib with LBBB, HR 69  Recent Labs: 11/13/2017: ALT 13; B Natriuretic Peptide 206.3 11/18/2017: BUN 12; Creatinine, Ser 0.62; Hemoglobin 12.5; Magnesium 1.7; Platelets 124; Potassium 3.8; Sodium 136   Lipid Panel No results found for: CHOL, TRIG, HDL, CHOLHDL, VLDL, LDLCALC, LDLDIRECT  Additional studies/ records that were reviewed today include:  [facility] T[facility]R OPERATIVE NOTE   Date of Procedure:11/17/2017  Preoperative Diagnosis:Severe Aortic Stenosis  Procedure:   Transcatheter Aortic Valve Replacement - Transfemoral Approach Edwards Sapien 3 THV (size 34mm, model # [id number], serial [id number])  Co-Surgeons:Christopher Angelena Form, MD and Gaye Pollack, MD   Pre-operative Echo Findings: ? Severe aortic stenosis ? Normalleft ventricular systolic function  Post-operative Echo Findings: ? Noparavalvular leak ? Normalleft ventricular systolic function _____________   Echo 11/18/16 Study Conclusions - Left ventricle: The cavity size was normal. There was severe focal basal hypertrophy. Systolic function was normal. Wall motion was normal; there were no regional wall motion abnormalities. - Aortic valve: S/P 11mm Edwards Sapien AV bioprosthesis with moderate perivalvular leakt by pressure half time and mild to moderate visually. Mean gradient (S): 18 mm Hg. Valve area (VTI): 2.06 cm^2. Valve area (Vmax): 1.94 cm^2. Valve area (Vmean): 2.27 cm^2. Regurgitation pressure half-time: 277 ms. - Aorta: Aortic root dimension: 40 mm (ED). - Aortic root: The aortic root was mildly dilated. - Mitral valve: Severely calcified annulus. There was trivial regurgitation. Valve area by continuity equation (using LVOT flow): 3.16 cm^2.    ASSESSMENT & PLAN:   Severe AS s/p TAVR:doing well. Groin sites healing well. ECG with no HAVB. SBE prophylaxis discussed; he is edentulous and does not visit the dentist. I will see him back at 1 month with an echo.   HTN: BP mildly elevated initially but 110/65 on my personal recheck. Will get a BMET today given new start of Losartan during admission.   Chronic afib: HR well controlled. New LBBB. Continue Eliquis for thromboembolic prophylaxis   CAD: continue medical therapy.  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  I have made a referral to [facility]  Incidental findings: a number of incidental findings were found during pre op work up. These are listed below.   R common femoral/iliac AV fistula: outpatient appointment with [facility] will be arranged (referral placed and office will call patient with  an appointment).   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 Will defer to primary cardiologist  Nonspecific mild mediastinal, bilateral hilar and left supraclavicular lymphadenopathy: chest CT w/ contrast rec in 3-17mo recommended. I will have this set up  Subsolid 7 mm posterior right upper lobe pulmonary nodule: non-contrast CT recommended at 3-6 months to confirm persistence. I will have this set up   Medication Adjustments/Labs and Tests Ordered: Current medicines are reviewed at length with the patient today.  Concerns regarding medicines are outlined above.  Medication changes, Labs and Tests ordered today are listed in the Patient Instructions below. Patient Instructions  Medication Instructions:  Your provider recommends that you continue on your current medications as directed. Please refer to the Current Medication list given to you today.    Labwork: TODAY: BMET  Testing/Procedures: Joellen Jersey recommends you have a chest CT in 3 months.   Follow-Up: Keep your appointments for your echocardiogram and visit with Nell Range on 12/24/17. Please arrive by 1:30PM for these appointments.   Any Other Special Instructions Will Be Listed Below (If Applicable).     If you need a refill on your cardiac medications before your next appointment, please call your pharmacy.      Signed, Angelena Form, PA-C  11/26/2017 3:42 PM    [facility] [place], [place], [place]  [address] Phone: [phone]; Fax: [phone]

## 2017-11-26 ENCOUNTER — Encounter: Payer: Self-pay | Admitting: Physician Assistant

## 2017-11-26 ENCOUNTER — Ambulatory Visit: Payer: Medicare HMO | Admitting: Physician Assistant

## 2017-11-26 VITALS — BP 146/66 | HR 69 | Ht 70.5 in | Wt 211.0 lb

## 2017-11-26 DIAGNOSIS — R591 Generalized enlarged lymph nodes: Secondary | ICD-10-CM

## 2017-11-26 DIAGNOSIS — I4819 Other persistent atrial fibrillation: Secondary | ICD-10-CM | POA: Diagnosis not present

## 2017-11-26 DIAGNOSIS — I770 Arteriovenous fistula, acquired: Secondary | ICD-10-CM

## 2017-11-26 DIAGNOSIS — I10 Essential (primary) hypertension: Secondary | ICD-10-CM | POA: Diagnosis not present

## 2017-11-26 DIAGNOSIS — R911 Solitary pulmonary nodule: Secondary | ICD-10-CM

## 2017-11-26 DIAGNOSIS — Z952 Presence of prosthetic heart valve: Secondary | ICD-10-CM

## 2017-11-26 NOTE — Patient Instructions (Addendum)
Medication Instructions:  Your provider recommends that you continue on your current medications as directed. Please refer to the Current Medication list given to you today.    Labwork: TODAY: BMET  Testing/Procedures: Joellen Jersey recommends you have a chest CT in 3 months.   Follow-Up: Keep your appointments for your echocardiogram and visit with Nell Range on 12/24/17. Please arrive by 1:30PM for these appointments.   Any Other Special Instructions Will Be Listed Below (If Applicable).     If you need a refill on your cardiac medications before your next appointment, please call your pharmacy.

## 2017-11-27 LAB — BASIC METABOLIC PANEL
BUN/Creatinine Ratio: 22 (ref 10–24)
BUN: 13 mg/dL (ref 8–27)
CHLORIDE: 102 mmol/L (ref 96–106)
CO2: 25 mmol/L (ref 20–29)
Calcium: 9.4 mg/dL (ref 8.6–10.2)
Creatinine, Ser: 0.59 mg/dL — ABNORMAL LOW (ref 0.76–1.27)
GFR, EST AFRICAN AMERICAN: 111 mL/min/{1.73_m2} (ref >59)
GFR, EST NON AFRICAN AMERICAN: 96 mL/min/{1.73_m2} (ref >59)
Glucose: 100 mg/dL — ABNORMAL HIGH (ref 65–99)
POTASSIUM: 4.7 mmol/L (ref 3.5–5.2)
SODIUM: 141 mmol/L (ref 134–144)

## 2017-12-07 ENCOUNTER — Telehealth: Payer: Self-pay | Admitting: Physician Assistant

## 2017-12-07 NOTE — Telephone Encounter (Signed)
  [facility]   MULTIDISCIPLINARY HEART VALVE TEAM  Eric Perry was recently started on Losartan for elevated BPs. He called in to report fogginess and feeling like he had a sinus infection that he thinks is related to the medicine. We have decided to stop it and see how he does. He will keep a log of his BPs and I will review them at our office visit in a couple weeks and decide if we should add something different.   Angelena Form PA-C  [facility]

## 2017-12-11 ENCOUNTER — Encounter: Payer: Medicare HMO | Admitting: Vascular Surgery

## 2017-12-16 DIAGNOSIS — M25562 Pain in left knee: Secondary | ICD-10-CM | POA: Diagnosis not present

## 2017-12-24 ENCOUNTER — Ambulatory Visit: Payer: Medicare HMO | Admitting: Physician Assistant

## 2017-12-24 ENCOUNTER — Other Ambulatory Visit: Payer: Self-pay

## 2017-12-24 ENCOUNTER — Ambulatory Visit (HOSPITAL_COMMUNITY): Payer: Medicare HMO | Attending: Internal Medicine

## 2017-12-24 ENCOUNTER — Encounter: Payer: Self-pay | Admitting: Physician Assistant

## 2017-12-24 ENCOUNTER — Other Ambulatory Visit: Payer: Self-pay | Admitting: Physician Assistant

## 2017-12-24 VITALS — BP 146/80 | HR 91 | Ht 70.5 in | Wt 212.8 lb

## 2017-12-24 DIAGNOSIS — I2510 Atherosclerotic heart disease of native coronary artery without angina pectoris: Secondary | ICD-10-CM | POA: Diagnosis not present

## 2017-12-24 DIAGNOSIS — I770 Arteriovenous fistula, acquired: Secondary | ICD-10-CM | POA: Diagnosis not present

## 2017-12-24 DIAGNOSIS — Z952 Presence of prosthetic heart valve: Secondary | ICD-10-CM | POA: Insufficient documentation

## 2017-12-24 DIAGNOSIS — I10 Essential (primary) hypertension: Secondary | ICD-10-CM | POA: Diagnosis not present

## 2017-12-24 DIAGNOSIS — R911 Solitary pulmonary nodule: Secondary | ICD-10-CM

## 2017-12-24 DIAGNOSIS — I4819 Other persistent atrial fibrillation: Secondary | ICD-10-CM

## 2017-12-24 MED ORDER — ASPIRIN 81 MG PO TABS: 81.0000 mg | ORAL_TABLET | Freq: Every day | ORAL | Status: DC

## 2017-12-24 NOTE — Patient Instructions (Addendum)
Medication Instructions:  Continue to take baby aspirin 81 mg (EC) until 05/19/18.  If you need a refill on your cardiac medications before your next appointment, please call your pharmacy.   Lab work: none If you have labs (blood work) drawn today and your tests are completely normal, you will receive your results only by: Eric Perry MyChart Message (if you have MyChart) OR . A paper copy in the mail If you have any lab test that is abnormal or we need to change your treatment, we will call you to review the results.  Testing/Procedures: none  Follow-Up: Schedule appointment with Dr. Geraldo Pitter in Jan as recommended.  Any Other Special Instructions Will Be Listed Below (If Applicable).

## 2017-12-24 NOTE — Progress Notes (Signed)
[facility] [place]                                       Cardiology Office Note    Date:  12/25/2017   ID:  DAREL RICKETTS, DOB 10-09-1940, MRN [id number]  PCP:  Greig Right, MD  Cardiologist: Dr. Geraldo Pitter / Dr. Angelena Form & Dr. Cyndia Bent (TAVR)  CC: 1 month s/p TAVR  History of Present Illness:  Eric Perry is a 77 y.o. male with a history of HLD, DM on no meds, chronic atrial fibrillation on Eliquis, CAD s/p remote PCI to LAD and CTO RCA and severe AS s/p TAVR (11/17/17) who presents to clinic for follow up.   [facility] [place]. He reportedly had a coronary stent in 2001. He had an echo in October 2018 showing severe AS with a mean gradient of 47 mm Hg and an AVA of 0.9 cm2 with mild AI. He had a cath in November 2018 that by the report showed a totally occluded RCA with collaterals from the left and right and a 40% OM1. He says that he was asymptomatic so the plan was for close observation. He was sent to Dr. Geraldo Pitter in July by his PCP. An echo on 09/25/2017 showed a trileaflet aortic valve with severely thickened and moderately calcified leaflets with a mean gradient of 38-42 mm Hg and an AVA of 0.84-0.91. DI was 0.31. There was mild AI. There was mild MR with a severely dilated LA. The mitral valve was calcified with a mean gradient of 5 mm Hg and a peak of 15 mm Hg. LVEF was 55-60% with moderate LVH. Repeat [facility] 10/22/17 showed patent LAD stent with known CTO of RCA with R-->R collaterals and L--> R collaterals with otherwise mild-mod non obst CAD. Medical therapy was reccommended.  He underwent successful TAVR with a 29 mm Edwards Sapien 3 THV via the TF approach on 11/17/17. Post operative echo showed a mean gradient of 60mmHg and mod PVL. Given elevated gradients, plan was repeat echo at 1 month per protocol and if echo does not show improvement, we will do a TEE to better visualize the valve. BP was running high  during admission and Losartan 25mg  daily added.   He later called to report head pain and sinus pressure from the losartan and this was stopped. His BP has been good at home. I have reviewed his log.   Today he presents to clinic for follow up. Here with wife and daughter. There was some confusion about Aspirin + Elquis and he hasn't been taking his aspirin. He is doing well. No CP or SOB. No LE edema, orthopnea or PND. No dizziness or syncope. No blood in stool or urine. No palpitations. He is looking forward to the holidays. He will have all his grandkids over to trim the tree.   Past Medical History:  Diagnosis Date  . Atrial fibrillation, chronic    a. on Eliquis  . CAD (coronary artery disease)    a. s/p remote stenting to LAD, CTO RCA with collaterals  . Hyperlipidemia   . Osteoarthritis    "was probably in my knees" (11/17/2017)  . Prostate cancer ([place])   . S/P TAVR (transcatheter aortic valve replacement)   . Severe aortic stenosis     Past Surgical History:  Procedure Laterality Date  .  APPENDECTOMY    . CORONARY ANGIOPLASTY WITH STENT PLACEMENT  1990s  . INSERTION PROSTATE RADIATION SEED  ~ 2004  . INTRAOPERATIVE TRANSTHORACIC ECHOCARDIOGRAM N/A 11/17/2017   Procedure: INTRAOPERATIVE TRANSTHORACIC ECHOCARDIOGRAM;  Surgeon: Burnell Blanks, MD;  Location: [place];  Service: Open Heart Surgery;  Laterality: N/A;  . JOINT REPLACEMENT    . REPLACEMENT TOTAL KNEE BILATERAL Bilateral   . RIGHT/LEFT HEART CATH AND CORONARY ANGIOGRAPHY N/A 10/22/2017   Procedure: RIGHT/LEFT HEART CATH AND CORONARY ANGIOGRAPHY;  Surgeon: Burnell Blanks, MD;  Location: [facility];  Service: Cardiovascular;  Laterality: N/A;  . SHOULDER ARTHROSCOPY W/ ROTATOR CUFF REPAIR Left 2018  . TRANSCATHETER AORTIC VALVE REPLACEMENT, TRANSFEMORAL  11/17/2017  . TRANSCATHETER AORTIC VALVE REPLACEMENT, TRANSFEMORAL N/A 11/17/2017   Procedure: TRANSCATHETER AORTIC VALVE REPLACEMENT,  TRANSFEMORAL. 53mm Edwards SAPIEN 3 THV.;  Surgeon: Burnell Blanks, MD;  Location: [place];  Service: Open Heart Surgery;  Laterality: N/A;    Current Medications: Outpatient Medications Prior to Visit  Medication Sig Dispense Refill  . apixaban (ELIQUIS) 5 MG TABS tablet Take 5 mg by mouth 2 (two) times daily.    . lansoprazole (PREVACID) 15 MG capsule Take 15 mg by mouth daily before breakfast.     . morphine (MSIR) 15 MG tablet Take 15 mg by mouth 3 (three) times daily as needed for severe pain.     . Omega-3 Fatty Acids (FISH OIL) 1000 MG CAPS Take 1,000 mg by mouth 2 (two) times daily.     Vladimir Faster Glycol-Propyl Glycol (LUBRICANT EYE DROPS) 0.4-0.3 % SOLN Place 1 drop into both eyes 3 (three) times daily as needed (for dry eyes).     . simvastatin (ZOCOR) 40 MG tablet Take 40 mg by mouth at bedtime.     . tamsulosin (FLOMAX) 0.4 MG CAPS capsule Take 0.4 mg by mouth daily.     Marland Kitchen losartan (COZAAR) 25 MG tablet Take 1 tablet (25 mg total) by mouth daily. 30 tablet 11  . aspirin 81 MG tablet Take 81 mg by mouth daily.      No facility-administered medications prior to visit.      Allergies:   Rocephin [ceftriaxone sodium in dextrose] and Versed [midazolam]   Social History   Socioeconomic History  . Marital status: Married    Spouse name: Not on file  . Number of children: 4  . Years of education: Not on file  . Highest education level: Not on file  Occupational History  . Occupation: Retired Therapist, nutritional, plant superviisor  Social Needs  . Financial resource strain: Not on file  . Food insecurity:    Worry: Not on file    Inability: Not on file  . Transportation needs:    Medical: Not on file    Non-medical: Not on file  Tobacco Use  . Smoking status: Former Smoker    Packs/day: 0.50    Years: 10.00    Pack years: 5.00    Types: Cigarettes    Last attempt to quit: 02/11/1960    Years since quitting: 57.9  . Smokeless tobacco: Never Used  Substance and Sexual  Activity  . Alcohol use: Yes    Comment: 11/17/2017 "1 beer/month"  . Drug use: Never  . Sexual activity: Not Currently  Lifestyle  . Physical activity:    Days per week: Not on file    Minutes per session: Not on file  . Stress: Not on file  Relationships  . Social connections:  Talks on phone: Not on file    Gets together: Not on file    Attends religious service: Not on file    Active member of club or organization: Not on file    Attends meetings of clubs or organizations: Not on file    Relationship status: Not on file  Other Topics Concern  . Not on file  Social History Narrative  . Not on file     Family History:  The patient's family history includes Colon cancer in his mother.     ROS:   Please see the history of present illness.    ROS All other systems reviewed and are negative.   PHYSICAL EXAM:   VS:  BP (!) 146/80   Pulse 91   Ht 5' 10.5" (1.791 m)   Wt 212 lb 12.8 oz (96.5 kg)   SpO2 94%   BMI 30.10 kg/m    GEN: Well nourished, well developed, in no acute distress HEENT: normal Neck: no JVD or masses Cardiac: irreg irreg; soft flow murmur. No rubs, or gallops,no edema  Respiratory:  clear to auscultation bilaterally, normal work of breathing GI: soft, nontender, nondistended, + BS MS: no deformity or atrophy Skin: warm and dry, no rash Neuro:  Alert and Oriented x 3, Strength and sensation are intact Psych: euthymic mood, full affect   Wt Readings from Last 3 Encounters:  12/24/17 212 lb 12.8 oz (96.5 kg)  11/26/17 211 lb (95.7 kg)  11/18/17 217 lb 13 oz (98.8 kg)      Studies/Labs Reviewed:   EKG:  EKG is NOT ordered today.   Recent Labs: 11/13/2017: ALT 13; B Natriuretic Peptide 206.3 11/18/2017: Hemoglobin 12.5; Magnesium 1.7; Platelets 124 11/26/2017: BUN 13; Creatinine, Ser 0.59; Potassium 4.7; Sodium 141   Lipid Panel No results found for: CHOL, TRIG, HDL, CHOLHDL, VLDL, LDLCALC, LDLDIRECT  Additional studies/ records that were  reviewed today include:  [facility] T[facility]R OPERATIVE NOTE   Date of Procedure:11/17/2017  Preoperative Diagnosis:Severe Aortic Stenosis  Procedure:   Transcatheter Aortic Valve Replacement - Transfemoral Approach Edwards Sapien 3 THV (size 37mm, model # [id number], serial [id number])  Co-Surgeons:Christopher Angelena Form, MD and Gaye Pollack, MD   Pre-operative Echo Findings: ? Severe aortic stenosis ? Normalleft ventricular systolic function  Post-operative Echo Findings: ? Noparavalvular leak ? Normalleft ventricular systolic function _____________   Echo 11/18/16 Study Conclusions - Left ventricle: The cavity size was normal. There was severe focal basal hypertrophy. Systolic function was normal. Wall motion was normal; there were no regional wall motion abnormalities. - Aortic valve: S/P 31mm Edwards Sapien AV bioprosthesis with moderate perivalvular leakt by pressure half time and mild to moderate visually. Mean gradient (S): 18 mm Hg. Valve area (VTI): 2.06 cm^2. Valve area (Vmax): 1.94 cm^2. Valve area (Vmean): 2.27 cm^2. Regurgitation pressure half-time: 277 ms. - Aorta: Aortic root dimension: 40 mm (ED). - Aortic root: The aortic root was mildly dilated. - Mitral valve: Severely calcified annulus. There was trivial regurgitation. Valve area by continuity equation (using LVOT flow): 3.16 cm^2.  _______________  Echo 12/24/17 Study Conclusions - Left ventricle: The cavity size was normal. There was severe   focal hypertrophy of the septum and moderate global LV wall   thickening. Systolic function was mildly to moderately reduced.   The estimated ejection fraction was in the range of 40% to 45%.   Inferolateral wall hypokinesis. The study is not technically   sufficient to allow evaluation of LV diastolic function. -  Aortic valve: s/p Edwards Sapien TAVR  valve. Small paravalvular   leak. There was trivial regurgitation. Mean gradient (S): 15 mm   Hg. Peak gradient (S): 27 mm Hg. Valve area (VTI): 1.37 cm^2.   Valve area (Vmax): 1.16 cm^2. Valve area (Vmean): 1.26 cm^2. - Mitral valve: Calcified annulus. Mildly thickened leaflets .   There was trivial regurgitation. - Left atrium: Severely dilated. - Right atrium: Severely dilated. - Inferior vena cava: The vessel was normal in size. The   respirophasic diameter changes were in the normal range (= 50%),   consistent with normal central venous pressure. Impressions: - Compared to a study on 10/0/2019, there appears to be new   inferolateral hypokinesis with LVEF of 40-45%. The TAVR valve is   functioning normally with small persistent paravalvular leak.   There is severe biatrial enlargement.   ASSESSMENT & PLAN:   Severe AS s/p TAVR: echo today shows newly depressed EF (EF40-45%), normally functioning TAVR with small persistent PVL and mean gradient of 15 mm Hg. He has NYHA class I symptoms and feeling much improved since surgery. SBE prophylaxis discussed; he is edentulous and does not see the dentist. ASA can be discontinued after 6 months of therapy (05/2018).   New LV dysfunction: echo today shows EF 40-45% down from normal. I recently started him on Losartan and he could not tolerate this due to headache. We discussed starting him on Toprol XL, but he would like to hold off. He appears euvolemic off diuretic therapy.   HTN: BP mildly elevated today but review of log from home shows BP in good range. He would like to stay off any new meds at the moment  Chronic afib: rate well controlled. Continue Eliquis   CAD: stable. continue medical therapy.   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  I have made a referral to [facility]. He has an appointment with Dr. Donzetta Matters 12/20  Incidental findings: a number of incidental findings were found during pre op work up.  These are listed below. I also printed out a copy for his daughter to keep as well.    R common femoral/iliac AV fistula: he will see Dr. 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 Will defer to primary cardiologist  Nonspecific mild mediastinal, bilateral hilar and left supraclavicular lymphadenopathy: chest CT w/ contrast rec in 3-23mo recommended. Set up for January  Subsolid 7 mm posterior right upper lobe pulmonary nodule: non-contrast CT recommended at 3-6 months to confirm persistence. Set up for January   Medication Adjustments/Labs and Tests Ordered: Current medicines are reviewed at length with the patient today.  Concerns regarding medicines are outlined above.  Medication changes, Labs and Tests ordered today are listed in the Patient Instructions below. Patient Instructions  Medication Instructions:  Continue to take baby aspirin 81 mg (EC) until 05/19/18.  If you need a refill on your cardiac medications before your next appointment, please call your pharmacy.   Lab work: none If you have labs (blood work) drawn today and your tests are completely normal, you will receive your results only by: Marland Kitchen MyChart Message (if you have MyChart) OR . A paper copy in the mail If you have any lab test that is abnormal or we need to change your treatment, we will call you to review the results.  Testing/Procedures: none  Follow-Up: Schedule  appointment with Dr. Geraldo Pitter in Jan as recommended.  Any Other Special Instructions Will Be Listed Below (If Applicable).       Signed, Angelena Form, PA-C  12/25/2017 9:36 AM    [facility] [address], [place], [place]  [address] Phone: [phone]; Fax: [phone]

## 2018-01-05 DIAGNOSIS — Z96652 Presence of left artificial knee joint: Secondary | ICD-10-CM | POA: Diagnosis not present

## 2018-01-05 DIAGNOSIS — M25562 Pain in left knee: Secondary | ICD-10-CM | POA: Diagnosis not present

## 2018-01-12 DIAGNOSIS — M25562 Pain in left knee: Secondary | ICD-10-CM | POA: Diagnosis not present

## 2018-01-12 DIAGNOSIS — Z96652 Presence of left artificial knee joint: Secondary | ICD-10-CM | POA: Diagnosis not present

## 2018-01-13 ENCOUNTER — Encounter: Payer: Self-pay | Admitting: Thoracic Surgery (Cardiothoracic Vascular Surgery)

## 2018-01-29 ENCOUNTER — Ambulatory Visit (INDEPENDENT_AMBULATORY_CARE_PROVIDER_SITE_OTHER): Payer: Medicare HMO | Admitting: Vascular Surgery

## 2018-01-29 ENCOUNTER — Other Ambulatory Visit: Payer: Self-pay

## 2018-01-29 ENCOUNTER — Encounter: Payer: Self-pay | Admitting: Vascular Surgery

## 2018-01-29 VITALS — BP 150/74 | HR 81 | Temp 97.5°F | Resp 20 | Ht 70.0 in | Wt 212.0 lb

## 2018-01-29 DIAGNOSIS — I770 Arteriovenous fistula, acquired: Secondary | ICD-10-CM | POA: Diagnosis not present

## 2018-01-29 NOTE — Progress Notes (Signed)
Patient ID: Eric Perry, male   DOB: February 01, 1941, 77 y.o.   MRN: [id number]  Reason for Consult: New Patient (Initial Visit) (Femoral AV fistula)   Referred by Greig Right, MD  Subjective:     HPI:  Eric Perry is a 77 y.o. male here for evaluation of right femoral arteriovenous fistula.  States that he has had a previous cardiac catheterization at the [facility] recently underwent TAVR when this was discovered.  He does not have any right lower extremity swelling worse than the left.  Has history of bilateral knee replacements and that is his only complaint.  He walks with the aid of a cane.  No complaints related to today's visit.  Past Medical History:  Diagnosis Date  . Atrial fibrillation, chronic    a. on Eliquis  . CAD (coronary artery disease)    a. s/p remote stenting to LAD, CTO RCA with collaterals  . Hyperlipidemia   . Osteoarthritis    "was probably in my knees" (11/17/2017)  . Prostate cancer ([place])   . S/P TAVR (transcatheter aortic valve replacement)   . Severe aortic stenosis    Family History  Problem Relation Age of Onset  . Colon cancer Mother   . CAD Neg Hx   . Sudden Cardiac Death Neg Hx    Past Surgical History:  Procedure Laterality Date  . APPENDECTOMY    . CORONARY ANGIOPLASTY WITH STENT PLACEMENT  1990s  . INSERTION PROSTATE RADIATION SEED  ~ 2004  . INTRAOPERATIVE TRANSTHORACIC ECHOCARDIOGRAM N/A 11/17/2017   Procedure: INTRAOPERATIVE TRANSTHORACIC ECHOCARDIOGRAM;  Surgeon: Burnell Blanks, MD;  Location: [place];  Service: Open Heart Surgery;  Laterality: N/A;  . JOINT REPLACEMENT    . REPLACEMENT TOTAL KNEE BILATERAL Bilateral   . RIGHT/LEFT HEART CATH AND CORONARY ANGIOGRAPHY N/A 10/22/2017   Procedure: RIGHT/LEFT HEART CATH AND CORONARY ANGIOGRAPHY;  Surgeon: Burnell Blanks, MD;  Location: [place] [facility];  Service: Cardiovascular;  Laterality: N/A;  . SHOULDER ARTHROSCOPY W/ ROTATOR CUFF REPAIR Left 2018  .  TRANSCATHETER AORTIC VALVE REPLACEMENT, TRANSFEMORAL  11/17/2017  . TRANSCATHETER AORTIC VALVE REPLACEMENT, TRANSFEMORAL N/A 11/17/2017   Procedure: TRANSCATHETER AORTIC VALVE REPLACEMENT, TRANSFEMORAL. 4mm Edwards SAPIEN 3 THV.;  Surgeon: Burnell Blanks, MD;  Location: [place];  Service: Open Heart Surgery;  Laterality: N/A;    Short Social History:  Social History   Tobacco Use  . Smoking status: Former Smoker    Packs/day: 0.50    Years: 10.00    Pack years: 5.00    Types: Cigarettes    Last attempt to quit: 02/11/1960    Years since quitting: 58.0  . Smokeless tobacco: Never Used  Substance Use Topics  . Alcohol use: Yes    Comment: 11/17/2017 "1 beer/month"    Allergies  Allergen Reactions  . Rocephin [Ceftriaxone Sodium In Dextrose] Anaphylaxis and Shortness Of Breath  . Versed [Midazolam] Shortness Of Breath    Current Outpatient Medications  Medication Sig Dispense Refill  . apixaban (ELIQUIS) 5 MG TABS tablet Take 5 mg by mouth 2 (two) times daily.    Marland Kitchen aspirin 81 MG tablet Take 1 tablet (81 mg total) by mouth daily. 30 tablet   . lansoprazole (PREVACID) 15 MG capsule Take 15 mg by mouth daily before breakfast.     . morphine (MSIR) 15 MG tablet Take 15 mg by mouth 3 (three) times daily as needed for severe pain.     . Omega-3 Fatty Acids (FISH  OIL) 1000 MG CAPS Take 1,000 mg by mouth 2 (two) times daily.     Vladimir Faster Glycol-Propyl Glycol (LUBRICANT EYE DROPS) 0.4-0.3 % SOLN Place 1 drop into both eyes 3 (three) times daily as needed (for dry eyes).     . simvastatin (ZOCOR) 40 MG tablet Take 40 mg by mouth at bedtime.     . tamsulosin (FLOMAX) 0.4 MG CAPS capsule Take 0.4 mg by mouth daily.      No current facility-administered medications for this visit.     Review of Systems  Constitutional:  Constitutional negative. HENT: HENT negative.  Eyes: Eyes negative.  Respiratory: Respiratory negative.  Cardiovascular: Cardiovascular negative.  GI:  Gastrointestinal negative.  Musculoskeletal: Positive for gait problem.  Skin: Skin negative.  Neurological: Positive for focal weakness.  Hematologic: Hematologic/lymphatic negative.  Psychiatric: Psychiatric negative.        Objective:  Objective   Vitals:   01/29/18 1304  BP: (!) 150/74  Pulse: 81  Resp: 20  Temp: (!) 97.5 F (36.4 C)  SpO2: 93%  Weight: 212 lb (96.2 kg)  Height: 5\' 10"  (1.778 m)   Body mass index is 30.42 kg/m.  Physical Exam Constitutional:      Appearance: Normal appearance.  Eyes:     Pupils: Pupils are equal, round, and reactive to light.  Neck:     Musculoskeletal: Normal range of motion and neck supple.  Cardiovascular:     Rate and Rhythm: Normal rate.     Pulses:          Radial pulses are 2+ on the right side and 2+ on the left side.       Femoral pulses are 2+ on the right side and 2+ on the left side. Pulmonary:     Effort: Pulmonary effort is normal.  Abdominal:     General: Abdomen is flat.     Palpations: Abdomen is soft.  Musculoskeletal: Normal range of motion.        General: No swelling.  Skin:    General: Skin is warm and dry.     Capillary Refill: Capillary refill takes less than 2 seconds.  Neurological:     General: No focal deficit present.     Mental Status: He is alert.  Psychiatric:        Mood and Affect: Mood normal.        Thought Content: Thought content normal.        Judgment: Judgment normal.     Data: I reviewed the patient's CT angios with him which demonstrates an arteriovenous fistula likely arising from the right sided profunda femoris artery and vein.     Assessment/Plan:     77 year old male with a quite sizable right femoral or profunda femoral arteriovenous fistula.  As he remains asymptomatic I discussed with him that I would not recommend any treatment.  Symptoms would likely be severe swelling of the right lower extremity and he knows to watch out for this although I doubt he will ever  have any issue as the fistula has likely been there for several years.  If there are issues I be happy to see him again.     Waynetta Sandy MD [facility]

## 2018-02-17 ENCOUNTER — Telehealth: Payer: Self-pay | Admitting: Cardiology

## 2018-02-17 DIAGNOSIS — I4819 Other persistent atrial fibrillation: Secondary | ICD-10-CM

## 2018-02-17 DIAGNOSIS — R0609 Other forms of dyspnea: Secondary | ICD-10-CM | POA: Diagnosis not present

## 2018-02-17 DIAGNOSIS — I10 Essential (primary) hypertension: Secondary | ICD-10-CM

## 2018-02-17 NOTE — Telephone Encounter (Signed)
Please  Put in new order for B-Met for bloodwork before CT. He is having at the [place] office

## 2018-02-17 NOTE — Telephone Encounter (Signed)
BMP order placed

## 2018-02-18 LAB — BASIC METABOLIC PANEL
BUN / CREAT RATIO: 18 (ref 10–24)
BUN: 11 mg/dL (ref 8–27)
CO2: 26 mmol/L (ref 20–29)
Calcium: 9.2 mg/dL (ref 8.6–10.2)
Chloride: 103 mmol/L (ref 96–106)
Creatinine, Ser: 0.62 mg/dL — ABNORMAL LOW (ref 0.76–1.27)
GFR calc Af Amer: 109 mL/min/{1.73_m2} (ref >59)
GFR calc non Af Amer: 94 mL/min/{1.73_m2} (ref >59)
Glucose: 93 mg/dL (ref 65–99)
Potassium: 4.5 mmol/L (ref 3.5–5.2)
SODIUM: 142 mmol/L (ref 134–144)

## 2018-02-19 ENCOUNTER — Other Ambulatory Visit: Payer: Medicare HMO

## 2018-02-24 ENCOUNTER — Encounter: Payer: Self-pay | Admitting: Cardiology

## 2018-02-24 ENCOUNTER — Ambulatory Visit (INDEPENDENT_AMBULATORY_CARE_PROVIDER_SITE_OTHER): Payer: Medicare HMO | Admitting: Cardiology

## 2018-02-24 VITALS — BP 164/82 | HR 76 | Ht 70.0 in | Wt 217.0 lb

## 2018-02-24 DIAGNOSIS — Z9861 Coronary angioplasty status: Secondary | ICD-10-CM

## 2018-02-24 DIAGNOSIS — I4891 Unspecified atrial fibrillation: Secondary | ICD-10-CM

## 2018-02-24 DIAGNOSIS — Z952 Presence of prosthetic heart valve: Secondary | ICD-10-CM

## 2018-02-24 DIAGNOSIS — I2510 Atherosclerotic heart disease of native coronary artery without angina pectoris: Secondary | ICD-10-CM | POA: Diagnosis not present

## 2018-02-24 DIAGNOSIS — E782 Mixed hyperlipidemia: Secondary | ICD-10-CM | POA: Diagnosis not present

## 2018-02-24 NOTE — Progress Notes (Signed)
Cardiology Office Note:    Date:  02/24/2018   ID:  Eric Perry, DOB 10-26-1940, MRN [id number]  PCP:  Greig Right, MD  Cardiologist:  Jenean Lindau, MD   Referring MD: Greig Right, MD    ASSESSMENT:    1. S/P TAVR (transcatheter aortic valve replacement)   2. CAD S/P percutaneous coronary angioplasty   3. Atrial fibrillation, unspecified type ([place])   4. Mixed hyperlipidemia    PLAN:    In order of problems listed above:  1. I discussed my findings with the patient at extensive length and secondary prevention stressed.  He is doing well post aortic valve replacement. 2. I discussed with the patient atrial fibrillation, disease process. Management and therapy including rate and rhythm control, anticoagulation benefits and potential risks were discussed extensively with the patient. Patient had multiple questions which were answered to patient's satisfaction. 3. Patient will be seen in follow-up appointment in 6 months or earlier if the patient has any concerns 4. Lipids are followed by his primary care physician.   Medication Adjustments/Labs and Tests Ordered: Current medicines are reviewed at length with the patient today.  Concerns regarding medicines are outlined above.  No orders of the defined types were placed in this encounter.  No orders of the defined types were placed in this encounter.    No chief complaint on file.    History of Present Illness:    Eric Perry is a 78 y.o. male patient has history of severe aortic stenosis and underwent TAVR surgery.  Subsequently he has done fine and he is active without any problems no chest pain orthopnea or PND.  Ambulates age appropriately limited because of his orthopedic issues.  At the time of my evaluation, the patient is alert awake oriented and in no distress.  Past Medical History:  Diagnosis Date  . Atrial fibrillation, chronic    a. on Eliquis  . CAD (coronary artery disease)    a. s/p  remote stenting to LAD, CTO RCA with collaterals  . Hyperlipidemia   . Osteoarthritis    "was probably in my knees" (11/17/2017)  . Prostate cancer ([place])   . S/P TAVR (transcatheter aortic valve replacement)   . Severe aortic stenosis     Past Surgical History:  Procedure Laterality Date  . APPENDECTOMY    . CORONARY ANGIOPLASTY WITH STENT PLACEMENT  1990s  . INSERTION PROSTATE RADIATION SEED  ~ 2004  . INTRAOPERATIVE TRANSTHORACIC ECHOCARDIOGRAM N/A 11/17/2017   Procedure: INTRAOPERATIVE TRANSTHORACIC ECHOCARDIOGRAM;  Surgeon: Burnell Blanks, MD;  Location: [facility];  Service: Open Heart Surgery;  Laterality: N/A;  . JOINT REPLACEMENT    . REPLACEMENT TOTAL KNEE BILATERAL Bilateral   . RIGHT/LEFT HEART CATH AND CORONARY ANGIOGRAPHY N/A 10/22/2017   Procedure: RIGHT/LEFT HEART CATH AND CORONARY ANGIOGRAPHY;  Surgeon: Burnell Blanks, MD;  Location: [facility];  Service: Cardiovascular;  Laterality: N/A;  . SHOULDER ARTHROSCOPY W/ ROTATOR CUFF REPAIR Left 2018  . TRANSCATHETER AORTIC VALVE REPLACEMENT, TRANSFEMORAL  11/17/2017  . TRANSCATHETER AORTIC VALVE REPLACEMENT, TRANSFEMORAL N/A 11/17/2017   Procedure: TRANSCATHETER AORTIC VALVE REPLACEMENT, TRANSFEMORAL. 69mm Edwards SAPIEN 3 THV.;  Surgeon: Burnell Blanks, MD;  Location: [facility];  Service: Open Heart Surgery;  Laterality: N/A;    Current Medications: Current Meds  Medication Sig  . apixaban (ELIQUIS) 5 MG TABS tablet Take 5 mg by mouth 2 (two) times daily.  Marland Kitchen aspirin 81 MG tablet Take 1 tablet (81 mg total)  by mouth daily.  . lansoprazole (PREVACID) 15 MG capsule Take 15 mg by mouth daily before breakfast.   . morphine (MSIR) 15 MG tablet Take 15 mg by mouth 3 (three) times daily as needed for severe pain.   . Omega-3 Fatty Acids (FISH OIL) 1000 MG CAPS Take 1,000 mg by mouth 2 (two) times daily.   Vladimir Faster Glycol-Propyl Glycol (LUBRICANT EYE DROPS) 0.4-0.3 % SOLN Place 1 drop into both eyes 3  (three) times daily as needed (for dry eyes).   . simvastatin (ZOCOR) 40 MG tablet Take 40 mg by mouth at bedtime.   . tamsulosin (FLOMAX) 0.4 MG CAPS capsule Take 0.4 mg by mouth daily.      Allergies:   Rocephin [ceftriaxone sodium in dextrose] and Versed [midazolam]   Social History   Socioeconomic History  . Marital status: Married    Spouse name: Not on file  . Number of children: 4  . Years of education: Not on file  . Highest education level: Not on file  Occupational History  . Occupation: Retired Therapist, nutritional, plant superviisor  Social Needs  . Financial resource strain: Not on file  . Food insecurity:    Worry: Not on file    Inability: Not on file  . Transportation needs:    Medical: Not on file    Non-medical: Not on file  Tobacco Use  . Smoking status: Former Smoker    Packs/day: 0.50    Years: 10.00    Pack years: 5.00    Types: Cigarettes    Last attempt to quit: 02/11/1960    Years since quitting: 58.0  . Smokeless tobacco: Never Used  Substance and Sexual Activity  . Alcohol use: Yes    Comment: 11/17/2017 "1 beer/month"  . Drug use: Never  . Sexual activity: Not Currently  Lifestyle  . Physical activity:    Days per week: Not on file    Minutes per session: Not on file  . Stress: Not on file  Relationships  . Social connections:    Talks on phone: Not on file    Gets together: Not on file    Attends religious service: Not on file    Active member of club or organization: Not on file    Attends meetings of clubs or organizations: Not on file    Relationship status: Not on file  Other Topics Concern  . Not on file  Social History Narrative  . Not on file     Family History: The patient's family history includes Colon cancer in his mother. There is no history of CAD or Sudden Cardiac Death.  ROS:   Please see the history of present illness.    All other systems reviewed and are negative.  EKGs/Labs/Other Studies Reviewed:    The  following studies were reviewed today: EKG reveals sinus rhythm and nonspecific ST-T changes   Recent Labs: 11/13/2017: ALT 13; B Natriuretic Peptide 206.3 11/18/2017: Hemoglobin 12.5; Magnesium 1.7; Platelets 124 02/17/2018: BUN 11; Creatinine, Ser 0.62; Potassium 4.5; Sodium 142  Recent Lipid Panel No results found for: CHOL, TRIG, HDL, CHOLHDL, VLDL, LDLCALC, LDLDIRECT  Physical Exam:    VS:  BP (!) 164/82 (BP Location: Right Arm, Patient Position: Sitting, Cuff Size: Normal)   Pulse 76   Ht 5\' 10"  (1.778 m)   Wt 217 lb (98.4 kg)   SpO2 94%   BMI 31.14 kg/m     Wt Readings from Last 3 Encounters:  02/24/18 217 lb (  98.4 kg)  01/29/18 212 lb (96.2 kg)  12/24/17 212 lb 12.8 oz (96.5 kg)     GEN: Patient is in no acute distress HEENT: Normal NECK: No JVD; No carotid bruits LYMPHATICS: No lymphadenopathy CARDIAC: Hear sounds regular, 2/6 systolic murmur at the apex. RESPIRATORY:  Clear to auscultation without rales, wheezing or rhonchi  ABDOMEN: Soft, non-tender, non-distended MUSCULOSKELETAL:  No edema; No deformity  SKIN: Warm and dry NEUROLOGIC:  Alert and oriented x 3 PSYCHIATRIC:  Normal affect   Signed, Jenean Lindau, MD  02/24/2018 1:37 PM    [facility]

## 2018-02-24 NOTE — Patient Instructions (Signed)

## 2018-02-26 ENCOUNTER — Ambulatory Visit (INDEPENDENT_AMBULATORY_CARE_PROVIDER_SITE_OTHER)
Admission: RE | Admit: 2018-02-26 | Discharge: 2018-02-26 | Disposition: A | Discharge status: Home / Self Care | Payer: Medicare HMO | Source: Ambulatory Visit | Attending: Physician Assistant | Admitting: Physician Assistant

## 2018-02-26 ENCOUNTER — Other Ambulatory Visit: Payer: Medicare HMO

## 2018-02-26 DIAGNOSIS — R911 Solitary pulmonary nodule: Secondary | ICD-10-CM

## 2018-02-26 DIAGNOSIS — R591 Generalized enlarged lymph nodes: Secondary | ICD-10-CM | POA: Diagnosis not present

## 2018-02-26 DIAGNOSIS — I712 Thoracic aortic aneurysm, without rupture: Secondary | ICD-10-CM | POA: Diagnosis not present

## 2018-02-26 MED ORDER — IOPAMIDOL (ISOVUE-300) INJECTION 61%
80.0000 mL | Freq: Once | INTRAVENOUS | Status: AC | PRN
  Administered 2018-02-26: 80 mL via INTRAVENOUS

## 2018-03-22 DIAGNOSIS — G894 Chronic pain syndrome: Secondary | ICD-10-CM | POA: Diagnosis not present

## 2018-03-22 DIAGNOSIS — Z9181 History of falling: Secondary | ICD-10-CM | POA: Diagnosis not present

## 2018-03-22 DIAGNOSIS — Z7901 Long term (current) use of anticoagulants: Secondary | ICD-10-CM | POA: Diagnosis not present

## 2018-03-22 DIAGNOSIS — Z79891 Long term (current) use of opiate analgesic: Secondary | ICD-10-CM | POA: Diagnosis not present

## 2018-03-22 DIAGNOSIS — Z0000 Encounter for general adult medical examination without abnormal findings: Secondary | ICD-10-CM | POA: Diagnosis not present

## 2018-03-22 DIAGNOSIS — I2510 Atherosclerotic heart disease of native coronary artery without angina pectoris: Secondary | ICD-10-CM | POA: Diagnosis not present

## 2018-03-22 DIAGNOSIS — I712 Thoracic aortic aneurysm, without rupture: Secondary | ICD-10-CM | POA: Diagnosis not present

## 2018-03-22 DIAGNOSIS — I714 Abdominal aortic aneurysm, without rupture: Secondary | ICD-10-CM | POA: Diagnosis not present

## 2018-03-22 DIAGNOSIS — Z8546 Personal history of malignant neoplasm of prostate: Secondary | ICD-10-CM | POA: Diagnosis not present

## 2018-04-21 DIAGNOSIS — M25562 Pain in left knee: Secondary | ICD-10-CM | POA: Diagnosis not present

## 2018-04-21 DIAGNOSIS — Z96652 Presence of left artificial knee joint: Secondary | ICD-10-CM | POA: Diagnosis not present

## 2018-09-21 DIAGNOSIS — S62303A Unspecified fracture of third metacarpal bone, left hand, initial encounter for closed fracture: Secondary | ICD-10-CM | POA: Diagnosis not present

## 2018-10-04 ENCOUNTER — Other Ambulatory Visit: Payer: Self-pay | Admitting: Physician Assistant

## 2018-10-04 DIAGNOSIS — Z952 Presence of prosthetic heart valve: Secondary | ICD-10-CM

## 2018-10-19 DIAGNOSIS — C61 Malignant neoplasm of prostate: Secondary | ICD-10-CM | POA: Diagnosis not present

## 2018-10-19 DIAGNOSIS — N400 Enlarged prostate without lower urinary tract symptoms: Secondary | ICD-10-CM | POA: Diagnosis not present

## 2018-10-19 DIAGNOSIS — Z8546 Personal history of malignant neoplasm of prostate: Secondary | ICD-10-CM | POA: Diagnosis not present

## 2018-10-21 DIAGNOSIS — M25512 Pain in left shoulder: Secondary | ICD-10-CM | POA: Diagnosis not present

## 2018-10-21 DIAGNOSIS — M75102 Unspecified rotator cuff tear or rupture of left shoulder, not specified as traumatic: Secondary | ICD-10-CM | POA: Diagnosis not present

## 2018-10-21 DIAGNOSIS — M12812 Other specific arthropathies, not elsewhere classified, left shoulder: Secondary | ICD-10-CM | POA: Diagnosis not present

## 2018-10-21 DIAGNOSIS — S62303A Unspecified fracture of third metacarpal bone, left hand, initial encounter for closed fracture: Secondary | ICD-10-CM | POA: Diagnosis not present

## 2018-10-21 DIAGNOSIS — M7542 Impingement syndrome of left shoulder: Secondary | ICD-10-CM | POA: Diagnosis not present

## 2018-10-21 DIAGNOSIS — S6992XA Unspecified injury of left wrist, hand and finger(s), initial encounter: Secondary | ICD-10-CM | POA: Diagnosis not present

## 2018-10-27 ENCOUNTER — Ambulatory Visit (INDEPENDENT_AMBULATORY_CARE_PROVIDER_SITE_OTHER): Payer: No Typology Code available for payment source

## 2018-10-27 ENCOUNTER — Other Ambulatory Visit: Payer: Self-pay

## 2018-10-27 DIAGNOSIS — Z952 Presence of prosthetic heart valve: Secondary | ICD-10-CM

## 2018-10-27 NOTE — Progress Notes (Signed)
Complete echocardiogram has been performed.  Jimmy Naara Kelty RDCS, RVT 

## 2018-10-28 ENCOUNTER — Telehealth: Payer: Self-pay | Admitting: *Deleted

## 2018-10-28 ENCOUNTER — Telehealth (INDEPENDENT_AMBULATORY_CARE_PROVIDER_SITE_OTHER): Payer: Medicare HMO | Admitting: Physician Assistant

## 2018-10-28 VITALS — BP 122/58 | HR 68 | Ht 71.0 in | Wt 201.0 lb

## 2018-10-28 DIAGNOSIS — I4819 Other persistent atrial fibrillation: Secondary | ICD-10-CM

## 2018-10-28 DIAGNOSIS — Z952 Presence of prosthetic heart valve: Secondary | ICD-10-CM | POA: Diagnosis not present

## 2018-10-28 DIAGNOSIS — I712 Thoracic aortic aneurysm, without rupture, unspecified: Secondary | ICD-10-CM

## 2018-10-28 DIAGNOSIS — I714 Abdominal aortic aneurysm, without rupture, unspecified: Secondary | ICD-10-CM

## 2018-10-28 DIAGNOSIS — I10 Essential (primary) hypertension: Secondary | ICD-10-CM

## 2018-10-28 DIAGNOSIS — I2510 Atherosclerotic heart disease of native coronary artery without angina pectoris: Secondary | ICD-10-CM

## 2018-10-28 DIAGNOSIS — I770 Arteriovenous fistula, acquired: Secondary | ICD-10-CM

## 2018-10-28 NOTE — Patient Instructions (Signed)
Medication Instructions:  Your physician recommends that you continue on your current medications as directed. Please refer to the Current Medication list given to you today.  If you need a refill on your cardiac medications before your next appointment, please call your pharmacy.   Lab work: none If you have labs (blood work) drawn today and your tests are completely normal, you will receive your results only by: Marland Kitchen MyChart Message (if you have MyChart) OR . A paper copy in the mail If you have any lab test that is abnormal or we need to change your treatment, we will call you to review the results.  Testing/Procedures: none  Follow-Up: At [facility], you and your health needs are our priority.  As part of our continuing mission to provide you with exceptional heart care, we have created designated Provider Care Teams.  These Care Teams include your primary Cardiologist (physician) and Advanced Practice Providers (APPs -  Physician Assistants and Nurse Practitioners) who all work together to provide you with the care you need, when you need it. . Your physician wants you to follow-up in: 6 months with Dr. Geraldo Pitter. You will receive a reminder letter in the mail two months in advance. If you don't receive a letter, please call our office to schedule the follow-up appointment. Marland Kitchen

## 2018-10-28 NOTE — Telephone Encounter (Signed)
Virtual Visit Pre-Appointment Phone Call  "(Name), I am calling you today to discuss your upcoming appointment. We are currently trying to limit exposure to the virus that causes COVID-19 by seeing patients at home rather than in the office."  1. "What is the BEST phone number to call the day of the visit?" - include this in appointment notes [phone]  2. "Do you have or have access to (through a family member/friend) a smartphone with video capability that we can use for your visit?" no a. If yes - list this number in appt notes as "cell" (if different from BEST phone #) and list the appointment type as a VIDEO visit in appointment notes b. If no - list the appointment type as a PHONE visit in appointment notes  3. Confirm consent - "In the setting of the current Covid19 crisis, you are scheduled for a phone visit with your provider on 10/28/18 at 1:00  Just as we do with many in-office visits, in order for you to participate in this visit, we must obtain consent.  If you'd like, I can send this to your mychart (if signed up) or email for you to review.  Otherwise, I can obtain your verbal consent now.  All virtual visits are billed to your insurance company just like a normal visit would be.  By agreeing to a virtual visit, we'd like you to understand that the technology does not allow for your provider to perform an examination, and thus may limit your provider's ability to fully assess your condition. If your provider identifies any concerns that need to be evaluated in person, we will make arrangements to do so.  Finally, though the technology is pretty good, we cannot assure that it will always work on either your or our end, and in the setting of a video visit, we may have to convert it to a phone-only visit.  In either situation, we cannot ensure that we have a secure connection.  Are you willing to proceed?" STAFF: Did the patient verbally acknowledge consent to telehealth visit? Document  YES/NO here: yes  4. Advise patient to be prepared - "Two hours prior to your appointment, go ahead and check your blood pressure, pulse, oxygen saturation, and your weight (if you have the equipment to check those) and write them all down. When your visit starts, your provider will ask you for this information. If you have an Apple Watch or Kardia device, please plan to have heart rate information ready on the day of your appointment. Please have a pen and paper handy nearby the day of the visit as well."  5. Give patient instructions for MyChart download to smartphone OR [email or web address] as below if video visit (depending on what platform provider is using)  6. Inform patient they will receive a phone call 15 minutes prior to their appointment time (may be from unknown caller ID) so they should be prepared to answer    TELEPHONE CALL NOTE  Eric Perry has been deemed a candidate for a follow-up tele-health visit to limit community exposure during the Covid-19 pandemic. I spoke with the patient via phone to ensure availability of phone/video source, confirm preferred email & phone number, and discuss instructions and expectations.  I reminded Eric Perry to be prepared with any vital sign and/or heart rhythm information that could potentially be obtained via home monitoring, at the time of his visit. I reminded Eric Perry to expect a phone call prior to  his visit.  Eric Liverpool, RN 10/28/2018 12:38 PM   INSTRUCTIONS FOR DOWNLOADING THE MYCHART APP TO SMARTPHONE  - The patient must first make sure to have activated MyChart and know their login information - If Apple, go to [employer] and type in MyChart in the search bar and download the app. If Android, ask patient to go to [employer] and type in [place] in the search bar and download the app. The app is free but as with any other app downloads, their phone may require them to verify saved payment information or  Apple/Android password.  - The patient will need to then log into the app with their MyChart username and password, and select [facility] as their healthcare provider to link the account. When it is time for your visit, go to the MyChart app, find appointments, and click Begin Video Visit. Be sure to Select Allow for your device to access the Microphone and Camera for your visit. You will then be connected, and your provider will be with you shortly.  **If they have any issues connecting, or need assistance please contact MyChart service desk [phone])**  **If using a computer, in order to ensure the best quality for their visit they will need to use either of the following Internet Browsers: [employer], or Google Chrome**  IF USING DOXIMITY or DOXY.ME - The patient will receive a link just prior to their visit by text.     FULL LENGTH CONSENT FOR TELE-HEALTH VISIT   I hereby voluntarily request, consent and authorize [facility] and its employed or contracted physicians, physician assistants, nurse practitioners or other licensed health care professionals (the Practitioner), to provide me with telemedicine health care services (the "Services") as deemed necessary by the treating Practitioner. I acknowledge and consent to receive the Services by the Practitioner via telemedicine. I understand that the telemedicine visit will involve communicating with the Practitioner through live audiovisual communication technology and the disclosure of certain medical information by electronic transmission. I acknowledge that I have been given the opportunity to request an in-person assessment or other available alternative prior to the telemedicine visit and am voluntarily participating in the telemedicine visit.  I understand that I have the right to withhold or withdraw my consent to the use of telemedicine in the course of my care at any time, without affecting my right to future care  or treatment, and that the Practitioner or I may terminate the telemedicine visit at any time. I understand that I have the right to inspect all information obtained and/or recorded in the course of the telemedicine visit and may receive copies of available information for a reasonable fee.  I understand that some of the potential risks of receiving the Services via telemedicine include:  Marland Kitchen Delay or interruption in medical evaluation due to technological equipment failure or disruption; . Information transmitted may not be sufficient (e.g. poor resolution of images) to allow for appropriate medical decision making by the Practitioner; and/or  . In rare instances, security protocols could fail, causing a breach of personal health information.  Furthermore, I acknowledge that it is my responsibility to provide information about my medical history, conditions and care that is complete and accurate to the best of my ability. I acknowledge that Practitioner's advice, recommendations, and/or decision may be based on factors not within their control, such as incomplete or inaccurate data provided by me or distortions of diagnostic images or specimens that may result from electronic transmissions. I  understand that the practice of medicine is not an exact science and that Practitioner makes no warranties or guarantees regarding treatment outcomes. I acknowledge that I will receive a copy of this consent concurrently upon execution via email to the email address I last provided but may also request a printed copy by calling the office of [facility].    I understand that my insurance will be billed for this visit.   I have read or had this consent read to me. . I understand the contents of this consent, which adequately explains the benefits and risks of the Services being provided via telemedicine.  . I have been provided ample opportunity to ask questions regarding this consent and the Services and have had  my questions answered to my satisfaction. . I give my informed consent for the services to be provided through the use of telemedicine in my medical care  By participating in this telemedicine visit I agree to the above.

## 2018-10-28 NOTE — Progress Notes (Signed)
[facility]   MULTIDISCIPLINARY HEART VALVE TEAM  Virtual Visit via Telephone Note   This visit type was conducted due to national recommendations for restrictions regarding the COVID-19 Pandemic (e.g. social distancing) in an effort to limit this patient's exposure and mitigate transmission in our community.  Due to his co-morbid illnesses, this patient is at least at moderate risk for complications without adequate follow up.  This format is felt to be most appropriate for this patient at this time.  The patient did not have access to video technology/had technical difficulties with video requiring transitioning to audio format only (telephone).  All issues noted in this document were discussed and addressed.  No physical exam could be performed with this format.  Please refer to the patient's chart for his  consent to telehealth for [facility].   Evaluation Performed:  Follow-up visit  Date:  10/28/2018   ID:  [name], DOB 10-Apr-1940, MRN [id number]  Patient Location: Home Provider Location: Office  PCP:  Greig Right, MD  Cardiologist: Dr. Geraldo Pitter / Dr. Angelena Form & Dr. Veto Kemps)  Chief Complaint:  1 year s/p TAVR   History of Present Illness:    Eric Perry is a 78 y.o. male with a history of HTN, HLD, DM on no meds, chronic atrial fibrillation on Eliquis, CAD s/p remote PCI to LADand CTO RCA, ascending thoracic aortic aneurysm, AAA and severe AS s/p TAVR (11/17/17) who presents via telemedicine for follow up.   The patient does not have symptoms concerning for COVID-19 infection (fever, chills, cough, or new shortness of breath).   [facility] [place]. He reportedly had a coronary stent in 2001. He had an echo in October 2018 showing severe AS with a mean gradient of 47 mm Hg and an AVA of 0.9 cm2 with mild AI. He had a cath in November 2018 that by the report showed a totally occluded RCA with collaterals from the left and  right and a 40% OM1. He said that he was asymptomatic so the plan was for close observation of severe AS. He was sent to Dr. Geraldo Pitter in July by his PCP. An echo on 09/25/2017 showed a trileaflet aortic valve with severely thickened and moderately calcified leaflets with a mean gradient of 38-42 mm Hg and an AVA of 0.84-0.91. There was mild AI. There was mild MR with a severely dilated LA. The mitral valve was calcified with a mean gradient of 5 mm Hg and a peak of 15 mm Hg. LVEF was 55-60% with moderate LVH. Repeat [facility] 10/22/17 showed patent LAD stent with known CTO of RCA with R-->R collaterals and L--> R collaterals with otherwise mild-mod non obst CAD. Medical therapy was reccommended.  He was evaluated by the multidisciplinary valve team and underwent successful TAVR with a24mm Edwards Sapien 3 THV via the TF approach on 11/17/17. 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 Donzetta Matters who recommended continued observation. 1 month echo showed worsening moderate worsening of his LV function to 40-45% with normally functioning TAVR with mean gradient of 15 mmHg and mild PVL.   Today he presents for follow up. No CP or SOB. No LE edema, orthopnea or PND. Occasionally gets some dizziness but no syncope. No blood in stool or urine. No palpitations. He stays busy doing work around the house and has  been staying home a lot given the pandemic.    Past Medical History:  Diagnosis Date  . Atrial fibrillation, chronic    a. on Eliquis  . CAD (coronary artery disease)    a. s/p remote stenting to LAD, CTO RCA with collaterals  . Hyperlipidemia   . Osteoarthritis    "was probably in my knees" (11/17/2017)  . Prostate cancer ([place])   . S/P TAVR (transcatheter aortic valve replacement)   . Severe aortic stenosis    Past Surgical History:  Procedure Laterality Date  . APPENDECTOMY    .  CORONARY ANGIOPLASTY WITH STENT PLACEMENT  1990s  . INSERTION PROSTATE RADIATION SEED  ~ 2004  . INTRAOPERATIVE TRANSTHORACIC ECHOCARDIOGRAM N/A 11/17/2017   Procedure: INTRAOPERATIVE TRANSTHORACIC ECHOCARDIOGRAM;  Surgeon: Burnell Blanks, MD;  Location: [facility];  Service: Open Heart Surgery;  Laterality: N/A;  . JOINT REPLACEMENT    . REPLACEMENT TOTAL KNEE BILATERAL Bilateral   . RIGHT/LEFT HEART CATH AND CORONARY ANGIOGRAPHY N/A 10/22/2017   Procedure: RIGHT/LEFT HEART CATH AND CORONARY ANGIOGRAPHY;  Surgeon: Burnell Blanks, MD;  Location: [facility];  Service: Cardiovascular;  Laterality: N/A;  . SHOULDER ARTHROSCOPY W/ ROTATOR CUFF REPAIR Left 2018  . TRANSCATHETER AORTIC VALVE REPLACEMENT, TRANSFEMORAL  11/17/2017  . TRANSCATHETER AORTIC VALVE REPLACEMENT, TRANSFEMORAL N/A 11/17/2017   Procedure: TRANSCATHETER AORTIC VALVE REPLACEMENT, TRANSFEMORAL. 21mm Edwards SAPIEN 3 THV.;  Surgeon: Burnell Blanks, MD;  Location: [place];  Service: Open Heart Surgery;  Laterality: N/A;     Current Meds  Medication Sig  . apixaban (ELIQUIS) 5 MG TABS tablet Take 5 mg by mouth 2 (two) times daily.  . lansoprazole (PREVACID) 15 MG capsule Take 15 mg by mouth daily before breakfast.   . morphine (MSIR) 15 MG tablet Take 15 mg by mouth 3 (three) times daily as needed for severe pain.   . Omega-3 Fatty Acids (FISH OIL) 1000 MG CAPS Take 1,000 mg by mouth 2 (two) times daily.   Vladimir Faster Glycol-Propyl Glycol (LUBRICANT EYE DROPS) 0.4-0.3 % SOLN Place 1 drop into both eyes 3 (three) times daily as needed (for dry eyes).   . simvastatin (ZOCOR) 40 MG tablet Take 40 mg by mouth at bedtime.   . tamsulosin (FLOMAX) 0.4 MG CAPS capsule Take 0.4 mg by mouth daily.      Allergies:   Rocephin [ceftriaxone sodium in dextrose] and Versed [midazolam]   Social History   Tobacco Use  . Smoking status: Former Smoker    Packs/day: 0.50    Years: 10.00    Pack years: 5.00    Types:  Cigarettes    Quit date: 02/11/1960    Years since quitting: 58.7  . Smokeless tobacco: Never Used  Substance Use Topics  . Alcohol use: Yes    Comment: 11/17/2017 "1 beer/month"  . Drug use: Never     Family Hx: The patient's family history includes Colon cancer in his mother. There is no history of CAD or Sudden Cardiac Death.  ROS:   Please see the history of present illness.    All other systems reviewed and are negative.   Prior CV studies:   The following studies were reviewed today:  TAVR OPERATIVE NOTE   Date of Procedure:11/17/2017  Preoperative Diagnosis:Severe Aortic Stenosis  Procedure:   Transcatheter Aortic Valve Replacement - Transfemoral Approach Edwards Sapien 3 THV (size 30mm, model # [id number], serial [id number])  Co-Surgeons:Christopher Angelena Form, MD and Gaye Pollack, MD   Pre-operative Echo Findings: ?  Severe aortic stenosis ? Normalleft ventricular systolic function  Post-operative Echo Findings: ? Noparavalvular leak ? Normalleft ventricular systolic function  _______________  Echo 12/24/17 Study Conclusions - Left ventricle: The cavity size was normal. There was severe focal hypertrophy of the septum and moderate global LV wall thickening. Systolic function was mildly to moderately reduced. The estimated ejection fraction was in the range of 40% to 45%. Inferolateral wall hypokinesis. The study is not technically sufficient to allow evaluation of LV diastolic function. - Aortic valve: s/p Edwards Sapien TAVR valve. Small paravalvular leak. There was trivial regurgitation. Mean gradient (S): 15 mm Hg. Peak gradient (S): 27 mm Hg. Valve area (VTI): 1.37 cm^2. Valve area (Vmax): 1.16 cm^2. Valve area (Vmean): 1.26 cm^2. - Mitral valve: Calcified annulus. Mildly thickened leaflets . There was trivial regurgitation. - Left atrium: Severely dilated.  - Right atrium: Severely dilated. - Inferior vena cava: The vessel was normal in size. The respirophasic diameter changes were in the normal range (= 50%),consistent with normal central venous pressure.  Impressions: - Compared to a study on 10/0/2019, there appears to be new inferolateral hypokinesis with LVEF of 40-45%. The TAVR valve isfunctioning normally with small persistent paravalvular leak. There is severe biatrial enlargement.  _____________   Echo 10/27/18 IMPRESSIONS  1. The left ventricle has mildly reduced systolic function, with an ejection fraction of 45-50%. The cavity size was mildly dilated. There is concentric left ventricular hypertrophy. Left ventricular diastolic Doppler parameters are consistent with  pseudonormalization. Left ventricular diffuse hypokinesis.  2. The right ventricle has normal systolic function. The cavity was mildly enlarged. There is No increase in right ventricular wall thickness. Right ventricular systolic pressure is normal with an estimated pressure of 34.8 mmHg.  3. Severe Biatrial enlargement.  4. An Wende Crease Sapien bioprosthetic aortic valve (TAVR) valve is present in the aortic position. Echo findings are consistent with mild perivalvular leak of the aortic prosthesis. No stenosis of the bioprosthesis.  5. Mild progressive mitral stenosis.  6. The pulmonic valve was grossly normal. Pulmonic valve regurgitation was not assessed by color flow Doppler.  7. The aorta is normal unless otherwise noted.  8. Compare to prior TTE 12/14/2017 left ventricular ejection fraction is slightly improved.   Labs/Other Tests and Data Reviewed:    EKG:  No ECG reviewed.  Recent Labs: 11/13/2017: ALT 13; B Natriuretic Peptide 206.3 11/18/2017: Hemoglobin 12.5; Magnesium 1.7; Platelets 124 02/17/2018: BUN 11; Creatinine, Ser 0.62; Potassium 4.5; Sodium 142   Recent Lipid Panel No results found for: CHOL, TRIG, HDL, CHOLHDL, LDLCALC, LDLDIRECT  Wt  Readings from Last 3 Encounters:  10/28/18 201 lb (91.2 kg)  02/24/18 217 lb (98.4 kg)  01/29/18 212 lb (96.2 kg)     Objective:    Vital Signs:  BP (!) 122/58   Pulse 68   Ht 5\' 11"  (1.803 m)   Wt 201 lb (91.2 kg)   BMI 28.03 kg/m    ASSESSMENT & PLAN:    Severe AS s/p TAVR: echo shows EF 45-50% (slightly improved from previous 40-45%), with normally functioning TAVR with mean gradient of 14.7 mm Hg and mild PVL. He has NYHA class I symptoms and doing quite well. SBE prophylaxis discussed; the patient is edentulous and does not go to the dentist.   HTN: BP appears to be elevated on all recent visits, but he reports it being normal at home off antihypertensives.   Chronic afib: rate well controlled on no rate lowering agents. Continue Eliquis 5mg  BID  CAD: stable. Continue medical therapy. Stop aspirin in the setting of need for long term anticoagulation. 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 Donzetta Matters and plan for continued observation.  Incidental findings: a number of incidental findings were found during pre op work up. These are listed below and have been sent to PCP for long term surveillance.    Ectatic 4.3 cm ascending thoracic aorta:yearly f/u recommended. CT in 02/2018 showed stable 4.4 cm asc thoracic aorta and recommended annual imaging (due 02/2019)  Infrarenal 3.7 cm AAA:2 year f/u recommended. Will defer to primary cardiologist   Nonspecific mild mediastinal, bilateral hilar and left supraclavicular lymphadenopathy:chest CT w/ contrast rec in 3-70morecommended. This was completed and felt to be stable but continued follow up recommended.  Subsolid 7 mm posterior right upper lobe pulmonary nodule:non-contrast CT recommended at 3-6 months to confirm persistence. This was completed and showed stable nodule but 5 year follow up recommended.    COVID-19 Education: The signs and symptoms of COVID-19 were  discussed with the patient and how to seek care for testing (follow up with PCP or arrange E-visit).  The importance of social distancing was discussed today.  Time:   Today, I have spent 12 minutes with the patient with telehealth technology discussing the above problems.     Medication Adjustments/Labs and Tests Ordered: Current medicines are reviewed at length with the patient today.  Concerns regarding medicines are outlined above.   Tests Ordered: No orders of the defined types were placed in this encounter.   Medication Changes: No orders of the defined types were placed in this encounter.   Disposition:  Follow up prn  Signed, Angelena Form, PA-C  10/28/2018 1:25 PM    [facility]

## 2018-12-14 DIAGNOSIS — N3090 Cystitis, unspecified without hematuria: Secondary | ICD-10-CM | POA: Diagnosis not present

## 2018-12-14 DIAGNOSIS — N3000 Acute cystitis without hematuria: Secondary | ICD-10-CM | POA: Diagnosis not present

## 2019-01-11 DIAGNOSIS — R42 Dizziness and giddiness: Secondary | ICD-10-CM | POA: Diagnosis not present

## 2019-01-11 IMAGING — CT CT CTA ABD/PEL W/CM AND/OR W/O CM
1 of 3 series · 1 of 29 positions shown · IV contrast (iopamidol)
Comparison: 07/30/2005 abdominal sonogram. 12/03/2013 chest
radiograph.

CLINICAL DATA: Severe symptomatic aortic stenosis. Dyspnea on
exertion. TAVR evaluation.

EXAM:
CT ANGIOGRAPHY CHEST, ABDOMEN AND PELVIS
TECHNIQUE: Multidetector CT imaging through the chest, abdomen and pelvis was
performed using the standard protocol during bolus administration of
intravenous contrast. Multiplanar reconstructed images and MIPs were
obtained and reviewed to evaluate the vascular anatomy.
CONTRAST:  100mL 1JL50L-5DX IOPAMIDOL (1JL50L-5DX) INJECTION 76%

[Series 555: — · 0.51mm/px · 1 of 8 slices shown]
[im 4/8]
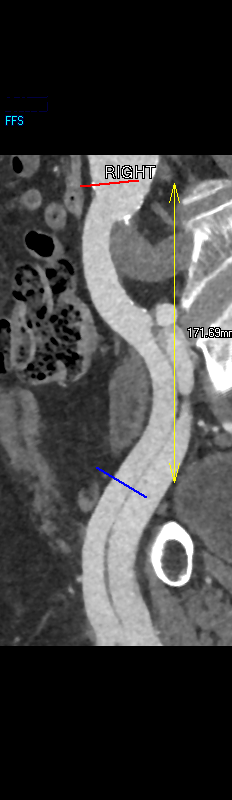

[1 of 29 positions shown; findings below may reference images not displayed]

FINDINGS: CTA CHEST FINDINGS

Cardiovascular: Mild-to-moderate cardiomegaly. Thickened and
coarsely calcified aortic valve. No significant pericardial
effusion/thickening. Left main and 3 vessel coronary
atherosclerosis. Atherosclerotic thoracic aorta with ectatic 4.3 cm
ascending thoracic aorta. Borderline prominent main pulmonary artery
(3.3 cm diameter). No central pulmonary emboli.

Mediastinum/Nodes: No discrete thyroid nodules. Unremarkable
esophagus. No axillary adenopathy. Mildly enlarged 1.3 cm left
supraclavicular node (series 14/image 14). Right paratracheal
adenopathy up to 1.4 cm (series 14/image 33). Mildly enlarged 1.3 cm
subcarinal node (series 14/image 51). Mild bilateral hilar
adenopathy up to 1.4 cm on the right (series 14/image 39) and 1.2 cm
on the left (series 14/image 50).

Lungs/Pleura: No pneumothorax. No pleural effusion. Subsolid 7 mm
posterior right upper lobe pulmonary nodule (series 15/image 25). No
acute consolidative airspace disease, lung masses or additional
significant pulmonary nodules.

Musculoskeletal: No aggressive appearing focal osseous lesions. Soft
tissue anchors are noted in the left humeral head. Moderate thoracic
spondylosis.

CTA ABDOMEN AND PELVIS FINDINGS

Hepatobiliary: Normal liver with no liver mass. Normal gallbladder
with no radiopaque cholelithiasis. No biliary ductal dilatation.

Pancreas: Normal, with no mass or duct dilation.

Spleen: Normal size. No mass.

Adrenals/Urinary Tract: Normal adrenals. No hydronephrosis. Simple
3.9 cm posterior upper right renal cyst. Subcentimeter hypodense
renal cortical lesion in the interpolar left kidney is too small to
characterize and requires no follow-up. Diffuse bladder wall
thickening and trabeculation with numerous scattered bladder
diverticula, largest 3.8 cm in the posterior right bladder wall.

Stomach/Bowel: Normal non-distended stomach. Normal caliber small
bowel with no small bowel wall thickening. Appendectomy. Normal
large bowel with no diverticulosis, large bowel wall thickening or
pericolonic fat stranding.

Vascular/Lymphatic: Atherosclerotic abdominal aorta with 3.7 cm
infrarenal abdominal aortic aneurysm. Patent portal, splenic,
hepatic and renal veins. Asymmetric arterial phase enhancement of
the right common femoral and right iliac veins (series 14/image
192), cannot exclude right common femoral artery venous fistula. No
pathologically enlarged lymph nodes in the abdomen or pelvis.

Reproductive: Mildly enlarged prostate with brachytherapy seeds
surrounding the prostate.

Other: No pneumoperitoneum, ascites or focal fluid collection.

Musculoskeletal: No aggressive appearing focal osseous lesions.
Endplate based mixed lytic and sclerotic lesions throughout the
lumbar vertebrae, favor degenerative Schmorl's nodes. Moderate
lumbar spondylosis.

VASCULAR MEASUREMENTS PERTINENT TO TAVR:

AORTA:

Minimal Aortic 2iameter-Y9.Y x 21.2 mm (abdominal aorta on series
14/image 114)

Severity of Aortic Calcification-moderate, noting mild-to-moderate
atherosclerotic plaque in ascending thoracic aorta at the
sinotubular junction.

RIGHT PELVIS:

Right Common Iliac Artery -

Minimal 9iameter-RB.2 x 12.6 mm

Tortuosity-mild

Calcification-moderate

Right External Iliac Artery -

Minimal Niameter-M8.9 x 9.8 mm

Tortuosity-moderate

Calcification-none

Right Common Femoral Artery -

Minimal 5iameter-NK.V x 10.0 mm

Tortuosity-mild

Calcification-none

LEFT PELVIS:

Left Common Iliac Artery -

Minimal Liameter-J9.J x 13.6 mm

Tortuosity-moderate

Calcification-moderate

Left External Iliac Artery -

Minimal Biameter-M.4 x 9.1 mm

Tortuosity-mild

Calcification-none

Left Common Femoral Artery -

Minimal Oiameter-TS.T x 9.8 mm

Tortuosity-mild

Calcification-none

Review of the MIP images confirms the above findings.
IMPRESSION: 1. Vascular findings and measurements pertinent to potential TAVR
procedure, as detailed above.
2. Severe thickening and calcification of the aortic valve,
compatible with the reported clinical history of severe symptomatic
aortic stenosis.
3. Aortic Atherosclerosis (B5UQN-3CT.T).
4. Asymmetric arterial phase enhancement of the right common femoral
and right iliac veins, suggestive of a right common femoral
arteriovenous fistula.
5. Infrarenal 3.7 cm Abdominal Aortic Aneurysm (B5UQN-C7H.I).
Recommend follow-up aortic ultrasound in 2 years. This
recommendation follows ACR consensus guidelines: White Paper of the
ACR Incidental Findings Committee II on Vascular Findings. [HOSPITAL] 9437; [DATE].
6. Ectatic 4.3 cm ascending thoracic aorta. Recommend annual imaging
followup by CTA or MRA. This recommendation follows 5090
ACCF/AHA/AATS/ACR/ASA/SCA/ALLEY/MATIWANE/BENRABAH/AUJLA Guidelines for the
Diagnosis and Management of Patients with Thoracic Aortic Disease.
Circulation. 5090; 121: [id number].
7. Mild-to-moderate cardiomegaly. Left main and 3 vessel coronary
atherosclerosis.
8. Nonspecific mild mediastinal, bilateral hilar and left
supraclavicular lymphadenopathy. These nodes can be reassessed on
follow-up chest CT with IV contrast in 3-6 months.
9. Subsolid 7 mm posterior right upper lobe pulmonary nodule.
Follow-up non-contrast CT recommended at 3-6 months to confirm
persistence. If unchanged, and solid component remains <6 mm, annual
CT is recommended until 5 years of stability has been established.
If persistent these nodules should be considered highly suspicious
if the solid component of the nodule is 6 mm or greater in size and
enlarging. This recommendation follows the consensus statement:
Guidelines for Management of Incidental Pulmonary Nodules Detected
[DATE].
10. Diffuse bladder wall thickening and trabeculation with numerous
bladder diverticula, compatible with chronic bladder outlet
obstruction by the enlarged prostate.

## 2019-01-15 IMAGING — CR DG CHEST 2V
2 series · 2 of 2 positions shown · non-contrast
Comparison: 12/03/2013 chest radiograph.

CLINICAL DATA: Severe aortic stenosis, preoperative

EXAM:
CHEST - 2 VIEW

[w chest pa]
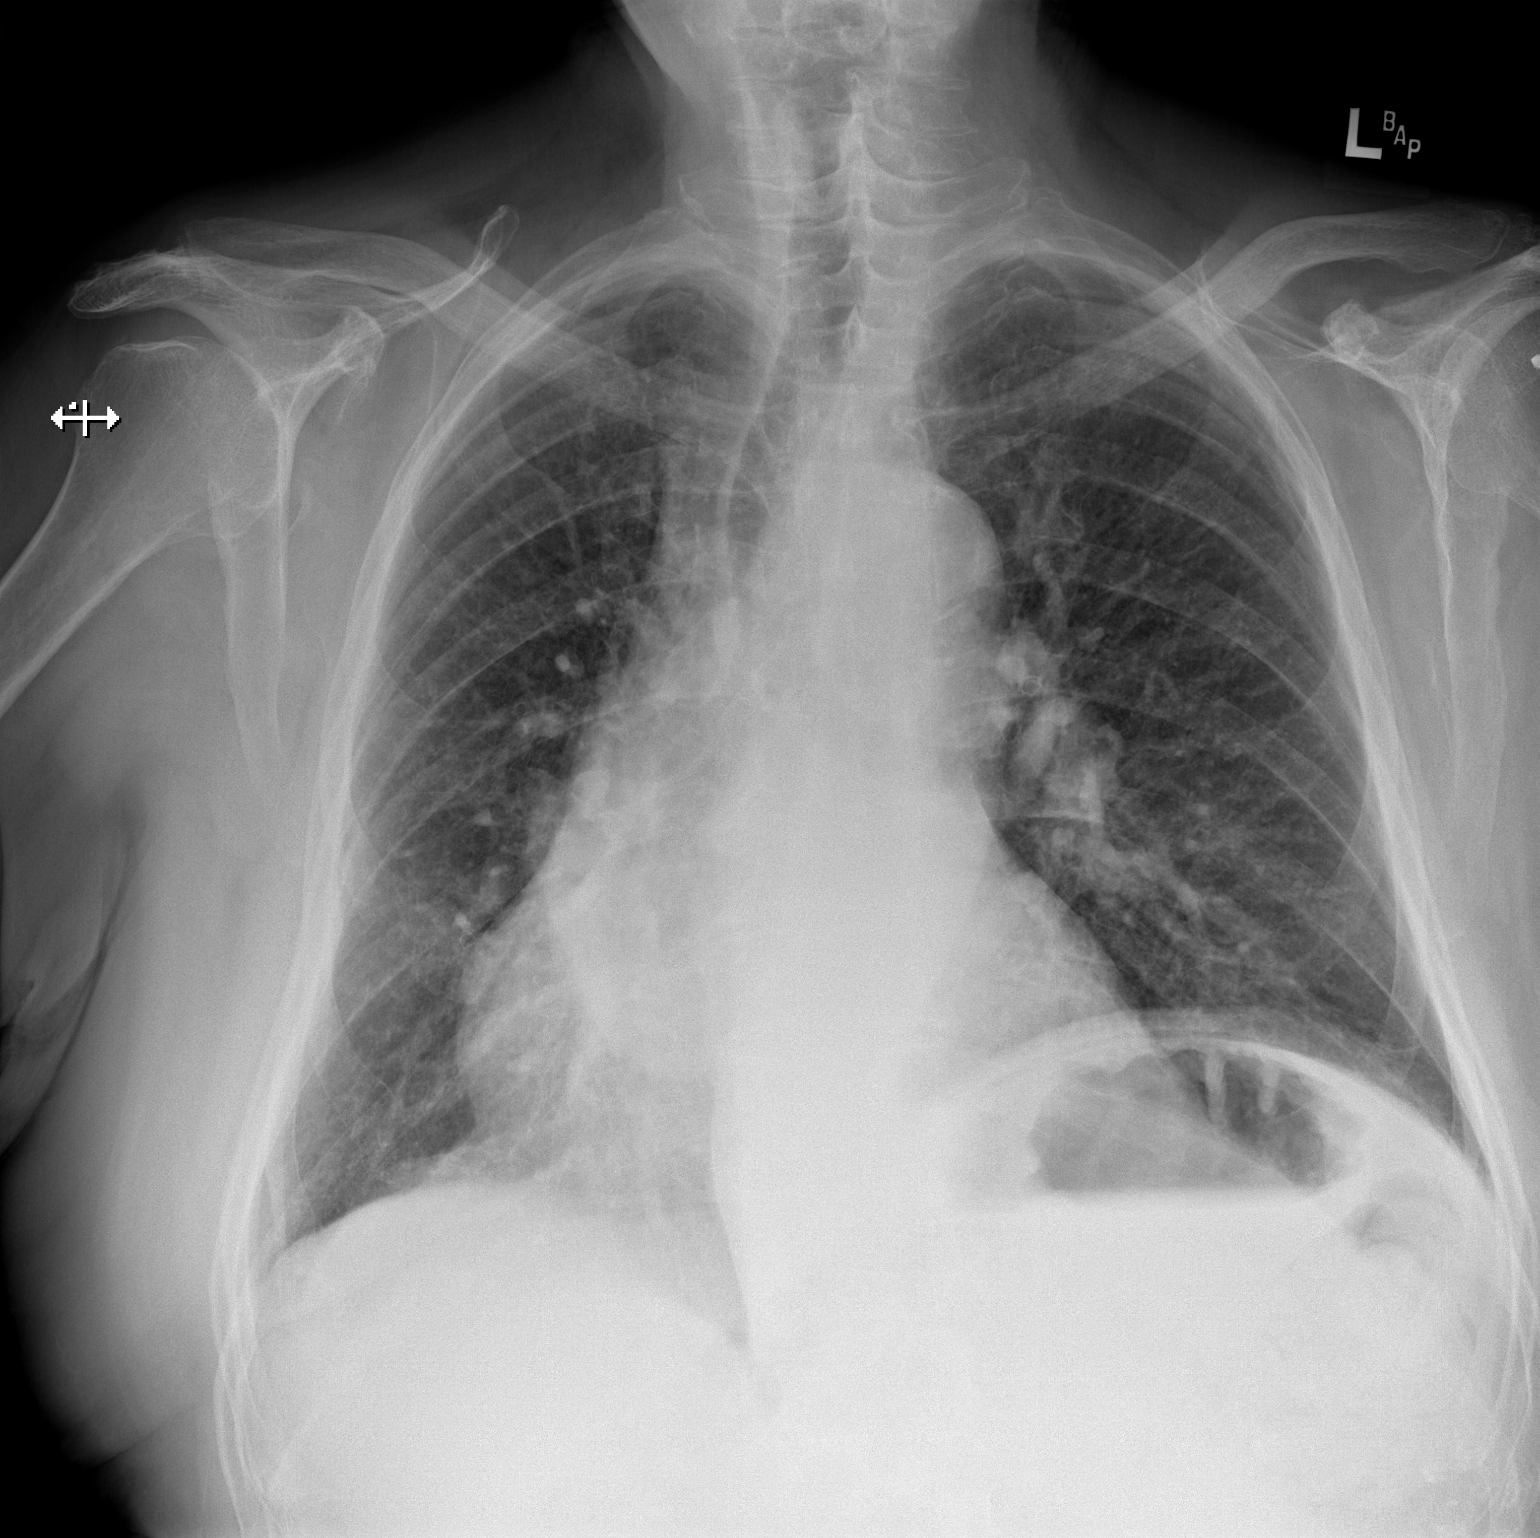

[w chest lat]
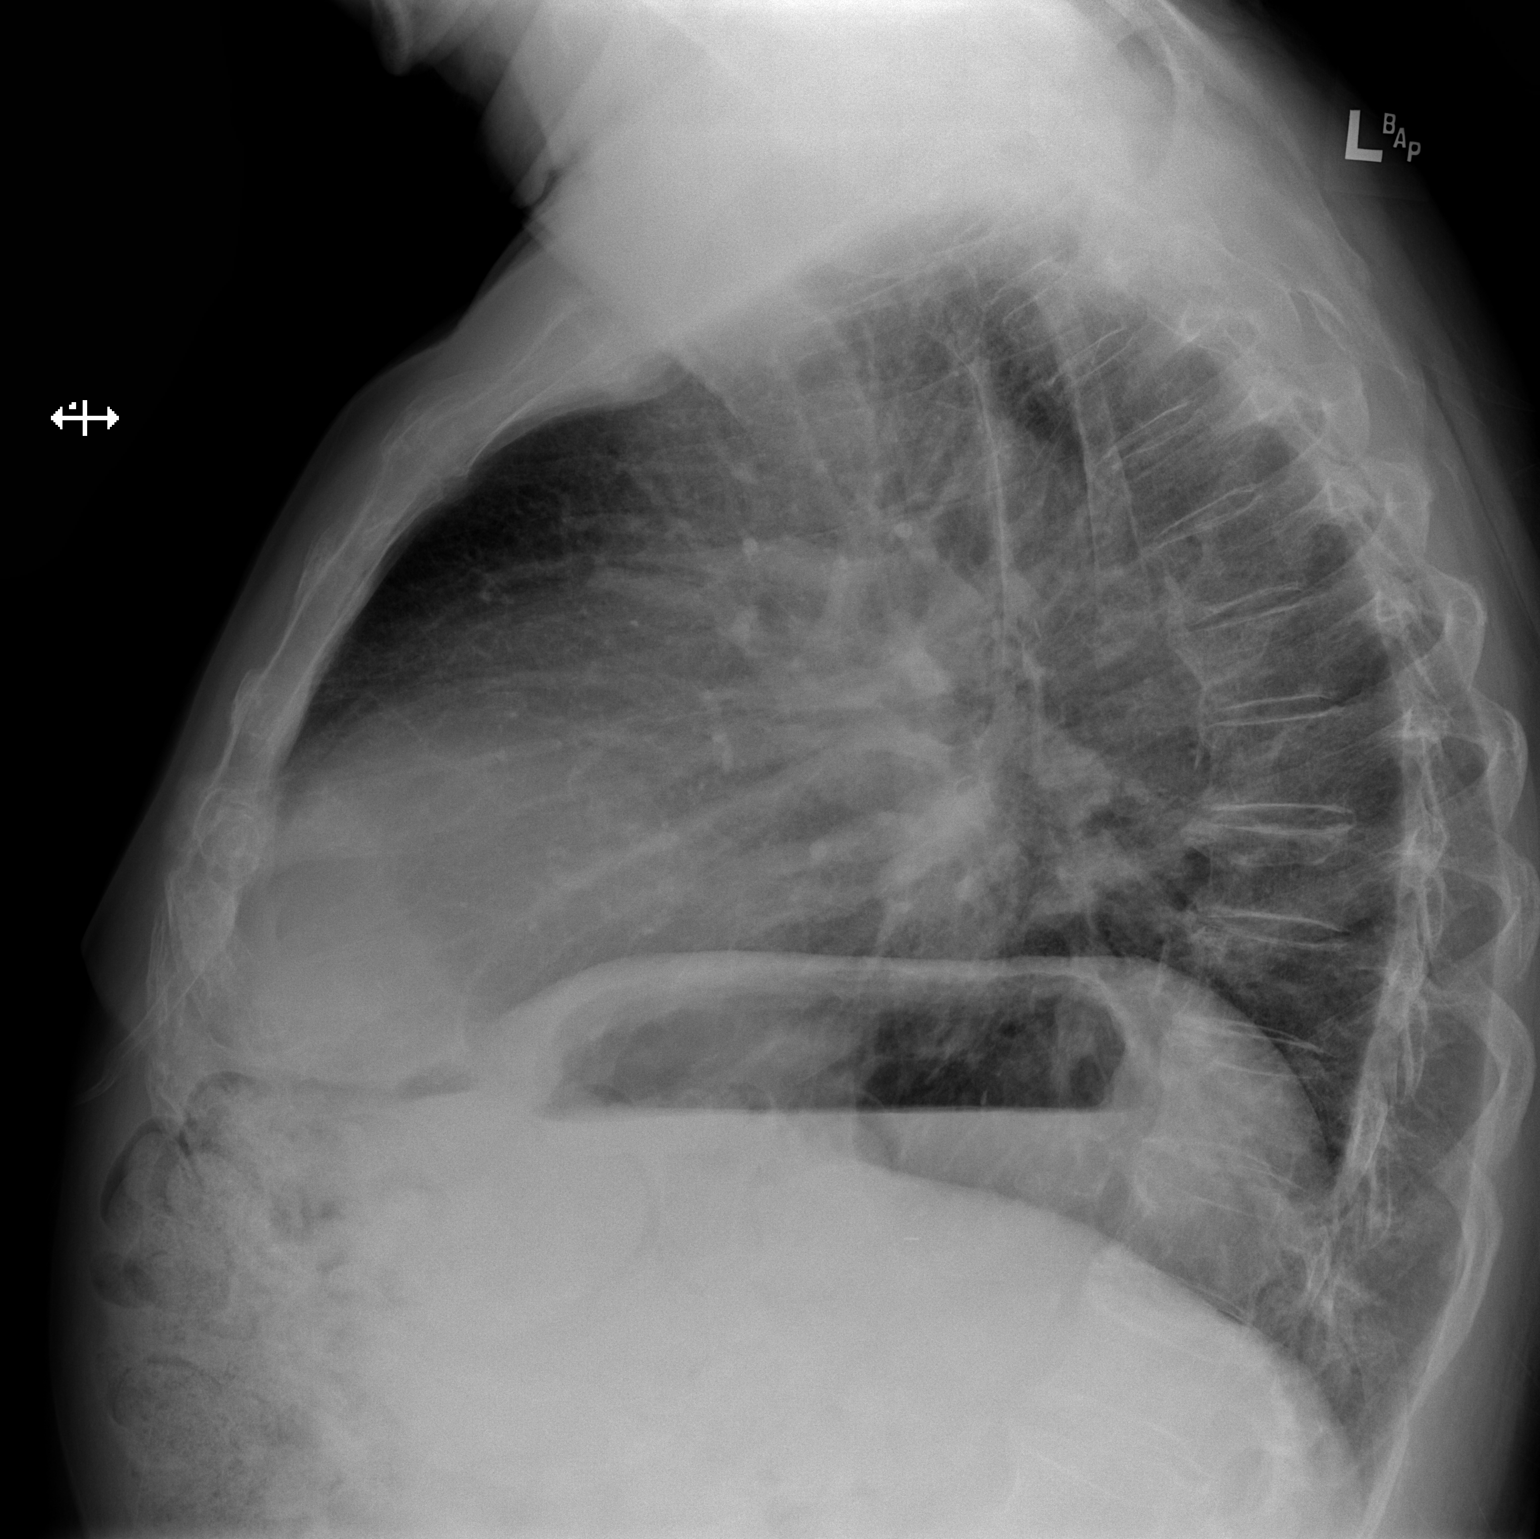

[2 of 2 positions shown; findings below may reference images not displayed]

FINDINGS: Stable cardiomediastinal silhouette with mild cardiomegaly. No
pneumothorax. No pleural effusion. No pulmonary edema. No acute
consolidative airspace disease. Stable mild elevation of the left
hemidiaphragm.
IMPRESSION: Stable cardiomegaly without overt pulmonary edema. No active
pulmonary disease.

## 2019-03-01 DIAGNOSIS — J9811 Atelectasis: Secondary | ICD-10-CM | POA: Diagnosis not present

## 2019-03-01 DIAGNOSIS — R0902 Hypoxemia: Secondary | ICD-10-CM | POA: Diagnosis not present

## 2019-03-01 DIAGNOSIS — U071 COVID-19: Secondary | ICD-10-CM | POA: Diagnosis not present

## 2019-03-01 DIAGNOSIS — R197 Diarrhea, unspecified: Secondary | ICD-10-CM | POA: Diagnosis not present

## 2019-03-03 DIAGNOSIS — Z9181 History of falling: Secondary | ICD-10-CM | POA: Diagnosis not present

## 2019-03-03 DIAGNOSIS — Z7952 Long term (current) use of systemic steroids: Secondary | ICD-10-CM | POA: Diagnosis not present

## 2019-03-03 DIAGNOSIS — U071 COVID-19: Secondary | ICD-10-CM | POA: Diagnosis not present

## 2019-03-03 DIAGNOSIS — I2510 Atherosclerotic heart disease of native coronary artery without angina pectoris: Secondary | ICD-10-CM | POA: Diagnosis not present

## 2019-03-03 DIAGNOSIS — Z1211 Encounter for screening for malignant neoplasm of colon: Secondary | ICD-10-CM | POA: Diagnosis not present

## 2019-03-03 DIAGNOSIS — M1990 Unspecified osteoarthritis, unspecified site: Secondary | ICD-10-CM | POA: Diagnosis not present

## 2019-03-03 DIAGNOSIS — I10 Essential (primary) hypertension: Secondary | ICD-10-CM | POA: Diagnosis not present

## 2019-03-03 DIAGNOSIS — Z7901 Long term (current) use of anticoagulants: Secondary | ICD-10-CM | POA: Diagnosis not present

## 2019-03-03 DIAGNOSIS — M5416 Radiculopathy, lumbar region: Secondary | ICD-10-CM | POA: Diagnosis not present

## 2019-03-03 DIAGNOSIS — Z8546 Personal history of malignant neoplasm of prostate: Secondary | ICD-10-CM | POA: Diagnosis not present

## 2019-03-03 DIAGNOSIS — I517 Cardiomegaly: Secondary | ICD-10-CM | POA: Diagnosis not present

## 2019-03-03 DIAGNOSIS — Z4889 Encounter for other specified surgical aftercare: Secondary | ICD-10-CM | POA: Diagnosis not present

## 2019-03-03 DIAGNOSIS — M545 Low back pain: Secondary | ICD-10-CM | POA: Diagnosis not present

## 2019-03-08 DIAGNOSIS — S0990XA Unspecified injury of head, initial encounter: Secondary | ICD-10-CM | POA: Diagnosis not present

## 2019-03-08 DIAGNOSIS — S098XXA Other specified injuries of head, initial encounter: Secondary | ICD-10-CM | POA: Diagnosis not present

## 2019-03-08 DIAGNOSIS — R531 Weakness: Secondary | ICD-10-CM | POA: Diagnosis not present

## 2019-03-08 DIAGNOSIS — S199XXA Unspecified injury of neck, initial encounter: Secondary | ICD-10-CM | POA: Diagnosis not present

## 2019-03-08 DIAGNOSIS — S2232XA Fracture of one rib, left side, initial encounter for closed fracture: Secondary | ICD-10-CM | POA: Diagnosis not present

## 2019-03-09 DIAGNOSIS — Z9181 History of falling: Secondary | ICD-10-CM | POA: Diagnosis not present

## 2019-03-09 DIAGNOSIS — I2510 Atherosclerotic heart disease of native coronary artery without angina pectoris: Secondary | ICD-10-CM | POA: Diagnosis not present

## 2019-03-09 DIAGNOSIS — U071 COVID-19: Secondary | ICD-10-CM | POA: Diagnosis not present

## 2019-03-09 DIAGNOSIS — Z7901 Long term (current) use of anticoagulants: Secondary | ICD-10-CM | POA: Diagnosis not present

## 2019-03-09 DIAGNOSIS — Z7952 Long term (current) use of systemic steroids: Secondary | ICD-10-CM | POA: Diagnosis not present

## 2019-03-09 DIAGNOSIS — Z8546 Personal history of malignant neoplasm of prostate: Secondary | ICD-10-CM | POA: Diagnosis not present

## 2019-03-09 DIAGNOSIS — M1990 Unspecified osteoarthritis, unspecified site: Secondary | ICD-10-CM | POA: Diagnosis not present

## 2019-03-09 DIAGNOSIS — I517 Cardiomegaly: Secondary | ICD-10-CM | POA: Diagnosis not present

## 2019-03-09 DIAGNOSIS — I10 Essential (primary) hypertension: Secondary | ICD-10-CM | POA: Diagnosis not present

## 2019-03-16 DIAGNOSIS — Z9181 History of falling: Secondary | ICD-10-CM | POA: Diagnosis not present

## 2019-03-16 DIAGNOSIS — Z7952 Long term (current) use of systemic steroids: Secondary | ICD-10-CM | POA: Diagnosis not present

## 2019-03-16 DIAGNOSIS — Z7901 Long term (current) use of anticoagulants: Secondary | ICD-10-CM | POA: Diagnosis not present

## 2019-03-16 DIAGNOSIS — Z8546 Personal history of malignant neoplasm of prostate: Secondary | ICD-10-CM | POA: Diagnosis not present

## 2019-03-16 DIAGNOSIS — U071 COVID-19: Secondary | ICD-10-CM | POA: Diagnosis not present

## 2019-03-16 DIAGNOSIS — I517 Cardiomegaly: Secondary | ICD-10-CM | POA: Diagnosis not present

## 2019-03-16 DIAGNOSIS — M1990 Unspecified osteoarthritis, unspecified site: Secondary | ICD-10-CM | POA: Diagnosis not present

## 2019-03-16 DIAGNOSIS — I2510 Atherosclerotic heart disease of native coronary artery without angina pectoris: Secondary | ICD-10-CM | POA: Diagnosis not present

## 2019-03-16 DIAGNOSIS — I10 Essential (primary) hypertension: Secondary | ICD-10-CM | POA: Diagnosis not present

## 2019-03-18 DIAGNOSIS — Z9181 History of falling: Secondary | ICD-10-CM | POA: Diagnosis not present

## 2019-03-18 DIAGNOSIS — M1990 Unspecified osteoarthritis, unspecified site: Secondary | ICD-10-CM | POA: Diagnosis not present

## 2019-03-18 DIAGNOSIS — Z8546 Personal history of malignant neoplasm of prostate: Secondary | ICD-10-CM | POA: Diagnosis not present

## 2019-03-18 DIAGNOSIS — U071 COVID-19: Secondary | ICD-10-CM | POA: Diagnosis not present

## 2019-03-18 DIAGNOSIS — I2510 Atherosclerotic heart disease of native coronary artery without angina pectoris: Secondary | ICD-10-CM | POA: Diagnosis not present

## 2019-03-18 DIAGNOSIS — I517 Cardiomegaly: Secondary | ICD-10-CM | POA: Diagnosis not present

## 2019-03-18 DIAGNOSIS — Z7901 Long term (current) use of anticoagulants: Secondary | ICD-10-CM | POA: Diagnosis not present

## 2019-03-18 DIAGNOSIS — I10 Essential (primary) hypertension: Secondary | ICD-10-CM | POA: Diagnosis not present

## 2019-03-18 DIAGNOSIS — Z7952 Long term (current) use of systemic steroids: Secondary | ICD-10-CM | POA: Diagnosis not present

## 2019-03-23 DIAGNOSIS — I517 Cardiomegaly: Secondary | ICD-10-CM | POA: Diagnosis not present

## 2019-03-23 DIAGNOSIS — Z7952 Long term (current) use of systemic steroids: Secondary | ICD-10-CM | POA: Diagnosis not present

## 2019-03-23 DIAGNOSIS — Z8546 Personal history of malignant neoplasm of prostate: Secondary | ICD-10-CM | POA: Diagnosis not present

## 2019-03-23 DIAGNOSIS — Z9181 History of falling: Secondary | ICD-10-CM | POA: Diagnosis not present

## 2019-03-23 DIAGNOSIS — I2510 Atherosclerotic heart disease of native coronary artery without angina pectoris: Secondary | ICD-10-CM | POA: Diagnosis not present

## 2019-03-23 DIAGNOSIS — U071 COVID-19: Secondary | ICD-10-CM | POA: Diagnosis not present

## 2019-03-23 DIAGNOSIS — M1990 Unspecified osteoarthritis, unspecified site: Secondary | ICD-10-CM | POA: Diagnosis not present

## 2019-03-23 DIAGNOSIS — Z7901 Long term (current) use of anticoagulants: Secondary | ICD-10-CM | POA: Diagnosis not present

## 2019-03-23 DIAGNOSIS — I10 Essential (primary) hypertension: Secondary | ICD-10-CM | POA: Diagnosis not present

## 2019-03-25 DIAGNOSIS — I2510 Atherosclerotic heart disease of native coronary artery without angina pectoris: Secondary | ICD-10-CM | POA: Diagnosis not present

## 2019-03-25 DIAGNOSIS — Z9181 History of falling: Secondary | ICD-10-CM | POA: Diagnosis not present

## 2019-03-25 DIAGNOSIS — M1990 Unspecified osteoarthritis, unspecified site: Secondary | ICD-10-CM | POA: Diagnosis not present

## 2019-03-25 DIAGNOSIS — Z7952 Long term (current) use of systemic steroids: Secondary | ICD-10-CM | POA: Diagnosis not present

## 2019-03-25 DIAGNOSIS — I10 Essential (primary) hypertension: Secondary | ICD-10-CM | POA: Diagnosis not present

## 2019-03-25 DIAGNOSIS — I517 Cardiomegaly: Secondary | ICD-10-CM | POA: Diagnosis not present

## 2019-03-25 DIAGNOSIS — Z7901 Long term (current) use of anticoagulants: Secondary | ICD-10-CM | POA: Diagnosis not present

## 2019-03-25 DIAGNOSIS — Z8546 Personal history of malignant neoplasm of prostate: Secondary | ICD-10-CM | POA: Diagnosis not present

## 2019-03-25 DIAGNOSIS — U071 COVID-19: Secondary | ICD-10-CM | POA: Diagnosis not present

## 2019-03-30 DIAGNOSIS — Z9181 History of falling: Secondary | ICD-10-CM | POA: Diagnosis not present

## 2019-03-30 DIAGNOSIS — I2510 Atherosclerotic heart disease of native coronary artery without angina pectoris: Secondary | ICD-10-CM | POA: Diagnosis not present

## 2019-03-30 DIAGNOSIS — Z7901 Long term (current) use of anticoagulants: Secondary | ICD-10-CM | POA: Diagnosis not present

## 2019-03-30 DIAGNOSIS — Z7952 Long term (current) use of systemic steroids: Secondary | ICD-10-CM | POA: Diagnosis not present

## 2019-03-30 DIAGNOSIS — I517 Cardiomegaly: Secondary | ICD-10-CM | POA: Diagnosis not present

## 2019-03-30 DIAGNOSIS — M1990 Unspecified osteoarthritis, unspecified site: Secondary | ICD-10-CM | POA: Diagnosis not present

## 2019-03-30 DIAGNOSIS — I10 Essential (primary) hypertension: Secondary | ICD-10-CM | POA: Diagnosis not present

## 2019-03-30 DIAGNOSIS — U071 COVID-19: Secondary | ICD-10-CM | POA: Diagnosis not present

## 2019-03-30 DIAGNOSIS — Z8546 Personal history of malignant neoplasm of prostate: Secondary | ICD-10-CM | POA: Diagnosis not present

## 2019-04-02 DIAGNOSIS — M1990 Unspecified osteoarthritis, unspecified site: Secondary | ICD-10-CM | POA: Diagnosis not present

## 2019-04-02 DIAGNOSIS — I2510 Atherosclerotic heart disease of native coronary artery without angina pectoris: Secondary | ICD-10-CM | POA: Diagnosis not present

## 2019-04-02 DIAGNOSIS — Z7952 Long term (current) use of systemic steroids: Secondary | ICD-10-CM | POA: Diagnosis not present

## 2019-04-02 DIAGNOSIS — U071 COVID-19: Secondary | ICD-10-CM | POA: Diagnosis not present

## 2019-04-02 DIAGNOSIS — I10 Essential (primary) hypertension: Secondary | ICD-10-CM | POA: Diagnosis not present

## 2019-04-02 DIAGNOSIS — Z7901 Long term (current) use of anticoagulants: Secondary | ICD-10-CM | POA: Diagnosis not present

## 2019-04-02 DIAGNOSIS — I517 Cardiomegaly: Secondary | ICD-10-CM | POA: Diagnosis not present

## 2019-04-02 DIAGNOSIS — Z9181 History of falling: Secondary | ICD-10-CM | POA: Diagnosis not present

## 2019-04-02 DIAGNOSIS — Z8546 Personal history of malignant neoplasm of prostate: Secondary | ICD-10-CM | POA: Diagnosis not present

## 2019-04-03 DIAGNOSIS — U071 COVID-19: Secondary | ICD-10-CM | POA: Diagnosis not present

## 2019-04-07 DIAGNOSIS — Z7952 Long term (current) use of systemic steroids: Secondary | ICD-10-CM | POA: Diagnosis not present

## 2019-04-07 DIAGNOSIS — Z8546 Personal history of malignant neoplasm of prostate: Secondary | ICD-10-CM | POA: Diagnosis not present

## 2019-04-07 DIAGNOSIS — I2510 Atherosclerotic heart disease of native coronary artery without angina pectoris: Secondary | ICD-10-CM | POA: Diagnosis not present

## 2019-04-07 DIAGNOSIS — I517 Cardiomegaly: Secondary | ICD-10-CM | POA: Diagnosis not present

## 2019-04-07 DIAGNOSIS — Z9181 History of falling: Secondary | ICD-10-CM | POA: Diagnosis not present

## 2019-04-07 DIAGNOSIS — M1990 Unspecified osteoarthritis, unspecified site: Secondary | ICD-10-CM | POA: Diagnosis not present

## 2019-04-07 DIAGNOSIS — I10 Essential (primary) hypertension: Secondary | ICD-10-CM | POA: Diagnosis not present

## 2019-04-07 DIAGNOSIS — U071 COVID-19: Secondary | ICD-10-CM | POA: Diagnosis not present

## 2019-04-07 DIAGNOSIS — Z7901 Long term (current) use of anticoagulants: Secondary | ICD-10-CM | POA: Diagnosis not present

## 2019-04-13 DIAGNOSIS — Z8546 Personal history of malignant neoplasm of prostate: Secondary | ICD-10-CM | POA: Diagnosis not present

## 2019-04-13 DIAGNOSIS — Z9181 History of falling: Secondary | ICD-10-CM | POA: Diagnosis not present

## 2019-04-13 DIAGNOSIS — I10 Essential (primary) hypertension: Secondary | ICD-10-CM | POA: Diagnosis not present

## 2019-04-13 DIAGNOSIS — I2510 Atherosclerotic heart disease of native coronary artery without angina pectoris: Secondary | ICD-10-CM | POA: Diagnosis not present

## 2019-04-13 DIAGNOSIS — U071 COVID-19: Secondary | ICD-10-CM | POA: Diagnosis not present

## 2019-04-13 DIAGNOSIS — Z7901 Long term (current) use of anticoagulants: Secondary | ICD-10-CM | POA: Diagnosis not present

## 2019-04-13 DIAGNOSIS — I517 Cardiomegaly: Secondary | ICD-10-CM | POA: Diagnosis not present

## 2019-04-13 DIAGNOSIS — M1990 Unspecified osteoarthritis, unspecified site: Secondary | ICD-10-CM | POA: Diagnosis not present

## 2019-04-13 DIAGNOSIS — Z7952 Long term (current) use of systemic steroids: Secondary | ICD-10-CM | POA: Diagnosis not present

## 2019-04-14 DIAGNOSIS — I517 Cardiomegaly: Secondary | ICD-10-CM | POA: Diagnosis not present

## 2019-04-14 DIAGNOSIS — Z7952 Long term (current) use of systemic steroids: Secondary | ICD-10-CM | POA: Diagnosis not present

## 2019-04-14 DIAGNOSIS — I10 Essential (primary) hypertension: Secondary | ICD-10-CM | POA: Diagnosis not present

## 2019-04-14 DIAGNOSIS — Z7901 Long term (current) use of anticoagulants: Secondary | ICD-10-CM | POA: Diagnosis not present

## 2019-04-14 DIAGNOSIS — Z9181 History of falling: Secondary | ICD-10-CM | POA: Diagnosis not present

## 2019-04-14 DIAGNOSIS — Z8546 Personal history of malignant neoplasm of prostate: Secondary | ICD-10-CM | POA: Diagnosis not present

## 2019-04-14 DIAGNOSIS — U071 COVID-19: Secondary | ICD-10-CM | POA: Diagnosis not present

## 2019-04-14 DIAGNOSIS — I2510 Atherosclerotic heart disease of native coronary artery without angina pectoris: Secondary | ICD-10-CM | POA: Diagnosis not present

## 2019-04-14 DIAGNOSIS — M1990 Unspecified osteoarthritis, unspecified site: Secondary | ICD-10-CM | POA: Diagnosis not present

## 2019-04-20 DIAGNOSIS — I517 Cardiomegaly: Secondary | ICD-10-CM | POA: Diagnosis not present

## 2019-04-20 DIAGNOSIS — U071 COVID-19: Secondary | ICD-10-CM | POA: Diagnosis not present

## 2019-04-20 DIAGNOSIS — I2510 Atherosclerotic heart disease of native coronary artery without angina pectoris: Secondary | ICD-10-CM | POA: Diagnosis not present

## 2019-04-20 DIAGNOSIS — I10 Essential (primary) hypertension: Secondary | ICD-10-CM | POA: Diagnosis not present

## 2019-04-20 DIAGNOSIS — Z7901 Long term (current) use of anticoagulants: Secondary | ICD-10-CM | POA: Diagnosis not present

## 2019-04-20 DIAGNOSIS — Z8546 Personal history of malignant neoplasm of prostate: Secondary | ICD-10-CM | POA: Diagnosis not present

## 2019-04-20 DIAGNOSIS — Z9181 History of falling: Secondary | ICD-10-CM | POA: Diagnosis not present

## 2019-04-20 DIAGNOSIS — M1990 Unspecified osteoarthritis, unspecified site: Secondary | ICD-10-CM | POA: Diagnosis not present

## 2019-04-20 DIAGNOSIS — Z7952 Long term (current) use of systemic steroids: Secondary | ICD-10-CM | POA: Diagnosis not present

## 2019-04-27 DIAGNOSIS — I10 Essential (primary) hypertension: Secondary | ICD-10-CM | POA: Diagnosis not present

## 2019-04-27 DIAGNOSIS — U071 COVID-19: Secondary | ICD-10-CM | POA: Diagnosis not present

## 2019-04-27 DIAGNOSIS — M1990 Unspecified osteoarthritis, unspecified site: Secondary | ICD-10-CM | POA: Diagnosis not present

## 2019-04-27 DIAGNOSIS — I2510 Atherosclerotic heart disease of native coronary artery without angina pectoris: Secondary | ICD-10-CM | POA: Diagnosis not present

## 2019-04-27 DIAGNOSIS — Z9181 History of falling: Secondary | ICD-10-CM | POA: Diagnosis not present

## 2019-04-27 DIAGNOSIS — Z7901 Long term (current) use of anticoagulants: Secondary | ICD-10-CM | POA: Diagnosis not present

## 2019-04-27 DIAGNOSIS — Z8546 Personal history of malignant neoplasm of prostate: Secondary | ICD-10-CM | POA: Diagnosis not present

## 2019-04-27 DIAGNOSIS — Z7952 Long term (current) use of systemic steroids: Secondary | ICD-10-CM | POA: Diagnosis not present

## 2019-04-27 DIAGNOSIS — I517 Cardiomegaly: Secondary | ICD-10-CM | POA: Diagnosis not present

## 2019-04-29 DIAGNOSIS — Z9181 History of falling: Secondary | ICD-10-CM | POA: Diagnosis not present

## 2019-04-29 DIAGNOSIS — Z7901 Long term (current) use of anticoagulants: Secondary | ICD-10-CM | POA: Diagnosis not present

## 2019-04-29 DIAGNOSIS — I10 Essential (primary) hypertension: Secondary | ICD-10-CM | POA: Diagnosis not present

## 2019-04-29 DIAGNOSIS — M1990 Unspecified osteoarthritis, unspecified site: Secondary | ICD-10-CM | POA: Diagnosis not present

## 2019-04-29 DIAGNOSIS — Z8546 Personal history of malignant neoplasm of prostate: Secondary | ICD-10-CM | POA: Diagnosis not present

## 2019-04-29 DIAGNOSIS — Z7952 Long term (current) use of systemic steroids: Secondary | ICD-10-CM | POA: Diagnosis not present

## 2019-04-29 DIAGNOSIS — I2510 Atherosclerotic heart disease of native coronary artery without angina pectoris: Secondary | ICD-10-CM | POA: Diagnosis not present

## 2019-04-29 DIAGNOSIS — I517 Cardiomegaly: Secondary | ICD-10-CM | POA: Diagnosis not present

## 2019-04-29 DIAGNOSIS — U071 COVID-19: Secondary | ICD-10-CM | POA: Diagnosis not present

## 2019-05-02 DIAGNOSIS — Z79899 Other long term (current) drug therapy: Secondary | ICD-10-CM | POA: Diagnosis not present

## 2019-05-02 DIAGNOSIS — Z8546 Personal history of malignant neoplasm of prostate: Secondary | ICD-10-CM | POA: Diagnosis not present

## 2019-05-02 DIAGNOSIS — Z8616 Personal history of COVID-19: Secondary | ICD-10-CM | POA: Diagnosis not present

## 2019-05-02 DIAGNOSIS — Z7901 Long term (current) use of anticoagulants: Secondary | ICD-10-CM | POA: Diagnosis not present

## 2019-05-02 DIAGNOSIS — I2510 Atherosclerotic heart disease of native coronary artery without angina pectoris: Secondary | ICD-10-CM | POA: Diagnosis not present

## 2019-05-02 DIAGNOSIS — R739 Hyperglycemia, unspecified: Secondary | ICD-10-CM | POA: Diagnosis not present

## 2019-05-02 DIAGNOSIS — I714 Abdominal aortic aneurysm, without rupture: Secondary | ICD-10-CM | POA: Diagnosis not present

## 2019-05-02 DIAGNOSIS — I712 Thoracic aortic aneurysm, without rupture: Secondary | ICD-10-CM | POA: Diagnosis not present

## 2019-05-03 ENCOUNTER — Ambulatory Visit (INDEPENDENT_AMBULATORY_CARE_PROVIDER_SITE_OTHER): Payer: Medicare HMO | Admitting: Cardiology

## 2019-05-03 ENCOUNTER — Encounter: Payer: Self-pay | Admitting: Emergency Medicine

## 2019-05-03 ENCOUNTER — Other Ambulatory Visit: Payer: Self-pay

## 2019-05-03 ENCOUNTER — Encounter: Payer: Self-pay | Admitting: Cardiology

## 2019-05-03 VITALS — BP 160/70 | HR 90 | Ht 71.0 in | Wt 196.0 lb

## 2019-05-03 DIAGNOSIS — Z952 Presence of prosthetic heart valve: Secondary | ICD-10-CM | POA: Diagnosis not present

## 2019-05-03 DIAGNOSIS — Z9861 Coronary angioplasty status: Secondary | ICD-10-CM

## 2019-05-03 DIAGNOSIS — I77810 Thoracic aortic ectasia: Secondary | ICD-10-CM

## 2019-05-03 DIAGNOSIS — I4891 Unspecified atrial fibrillation: Secondary | ICD-10-CM | POA: Diagnosis not present

## 2019-05-03 DIAGNOSIS — I2510 Atherosclerotic heart disease of native coronary artery without angina pectoris: Secondary | ICD-10-CM

## 2019-05-03 DIAGNOSIS — E782 Mixed hyperlipidemia: Secondary | ICD-10-CM | POA: Diagnosis not present

## 2019-05-03 HISTORY — DX: Thoracic aortic ectasia: I77.810

## 2019-05-03 NOTE — Progress Notes (Signed)
Cardiology Office Note:    Date:  05/03/2019   ID:  Eric Perry, DOB 10-23-1940, MRN [id number]  PCP:  Greig Right, MD  Cardiologist:  Jenean Lindau, MD   Referring MD: Greig Right, MD    ASSESSMENT:    1. Atrial fibrillation, unspecified type ([place])   2. Ascending aorta dilatation (HCC)   3. CAD S/P percutaneous coronary angioplasty   4. Mixed hyperlipidemia   5. S/P TAVR (transcatheter aortic valve replacement)    PLAN:    In order of problems listed above:  1. Atrial fibrillation:I discussed with the patient atrial fibrillation, disease process. Management and therapy including rate and rhythm control, anticoagulation benefits and potential risks were discussed extensively with the patient. Patient had multiple questions which were answered to patient's satisfaction. 2. Essential hypertension: Blood pressure is stable.  He has an element of whitecoat hypertension.  The blood pressure inspection by him to me from his home are fine.  We will continue to keep a track of it. 3. Mixed dyslipidemia.  We will try to obtain records from his primary care physician to review these. 4. Be seen in follow-up appointment in a month or earlier if he has any concerns.  Overall he mentions to me that he is feeling much better and continues to improve post Covid infection.   Medication Adjustments/Labs and Tests Ordered: Current medicines are reviewed at length with the patient today.  Concerns regarding medicines are outlined above.  No orders of the defined types were placed in this encounter.  No orders of the defined types were placed in this encounter.    Chief Complaint  Patient presents with  . Follow-up    6 Months     History of Present Illness:    Eric Perry is a 79 y.o. male.  Patient has history of aortic stenosis post TAVR procedure, essential hypertension, atrial fibrillation and recently had Covid infection and treated and released.  He is done well.   He has seen his primary care physician yesterday and she did blood work and will try to get a copy of those reports.  He mentions to me that his heart rate is about in the 80s at home and blood pressure 120/70 days.  No chest pain orthopnea or PND.  At the time of my evaluation, the patient is alert awake oriented and in no distress.  Past Medical History:  Diagnosis Date  . Atrial fibrillation, chronic (HCC)    a. on Eliquis  . CAD (coronary artery disease)    a. s/p remote stenting to LAD, CTO RCA with collaterals  . Hyperlipidemia   . Osteoarthritis    "was probably in my knees" (11/17/2017)  . Prostate cancer ([place])   . S/P TAVR (transcatheter aortic valve replacement)   . Severe aortic stenosis     Past Surgical History:  Procedure Laterality Date  . APPENDECTOMY    . CORONARY ANGIOPLASTY WITH STENT PLACEMENT  1990s  . INSERTION PROSTATE RADIATION SEED  ~ 2004  . INTRAOPERATIVE TRANSTHORACIC ECHOCARDIOGRAM N/A 11/17/2017   Procedure: INTRAOPERATIVE TRANSTHORACIC ECHOCARDIOGRAM;  Surgeon: Burnell Blanks, MD;  Location: [place];  Service: Open Heart Surgery;  Laterality: N/A;  . JOINT REPLACEMENT    . REPLACEMENT TOTAL KNEE BILATERAL Bilateral   . RIGHT/LEFT HEART CATH AND CORONARY ANGIOGRAPHY N/A 10/22/2017   Procedure: RIGHT/LEFT HEART CATH AND CORONARY ANGIOGRAPHY;  Surgeon: Burnell Blanks, MD;  Location: [place] [facility];  Service: Cardiovascular;  Laterality: N/A;  .  SHOULDER ARTHROSCOPY W/ ROTATOR CUFF REPAIR Left 2018  . TRANSCATHETER AORTIC VALVE REPLACEMENT, TRANSFEMORAL  11/17/2017  . TRANSCATHETER AORTIC VALVE REPLACEMENT, TRANSFEMORAL N/A 11/17/2017   Procedure: TRANSCATHETER AORTIC VALVE REPLACEMENT, TRANSFEMORAL. 4mm Edwards SAPIEN 3 THV.;  Surgeon: Burnell Blanks, MD;  Location: [place];  Service: Open Heart Surgery;  Laterality: N/A;    Current Medications: Current Meds  Medication Sig  . apixaban (ELIQUIS) 5 MG TABS tablet Take 5 mg by  mouth 2 (two) times daily.  . lansoprazole (PREVACID) 15 MG capsule Take 15 mg by mouth daily before breakfast.   . morphine (MSIR) 15 MG tablet Take 15 mg by mouth 3 (three) times daily as needed for severe pain.   . Omega-3 Fatty Acids (FISH OIL) 1000 MG CAPS Take 1,000 mg by mouth 2 (two) times daily.   Vladimir Faster Glycol-Propyl Glycol (LUBRICANT EYE DROPS) 0.4-0.3 % SOLN Place 1 drop into both eyes 3 (three) times daily as needed (for dry eyes).   . simvastatin (ZOCOR) 40 MG tablet Take 40 mg by mouth at bedtime.   . tamsulosin (FLOMAX) 0.4 MG CAPS capsule Take 0.4 mg by mouth daily.      Allergies:   Rocephin [ceftriaxone sodium in dextrose] and Versed [midazolam]   Social History   Socioeconomic History  . Marital status: Married    Spouse name: Not on file  . Number of children: 4  . Years of education: Not on file  . Highest education level: Not on file  Occupational History  . Occupation: Retired Therapist, nutritional, plant superviisor  Tobacco Use  . Smoking status: Former Smoker    Packs/day: 0.50    Years: 10.00    Pack years: 5.00    Types: Cigarettes    Quit date: 02/11/1960    Years since quitting: 59.2  . Smokeless tobacco: Never Used  Substance and Sexual Activity  . Alcohol use: Yes    Comment: 11/17/2017 "1 beer/month"  . Drug use: Never  . Sexual activity: Not Currently  Other Topics Concern  . Not on file  Social History Narrative  . Not on file   Social Determinants of Health   Financial Resource Strain:   . Difficulty of Paying Living Expenses:   Food Insecurity:   . Worried About Charity fundraiser in the Last Year:   . Arboriculturist in the Last Year:   Transportation Needs:   . Film/video editor (Medical):   Marland Kitchen Lack of Transportation (Non-Medical):   Physical Activity:   . Days of Exercise per Week:   . Minutes of Exercise per Session:   Stress:   . Feeling of Stress :   Social Connections:   . Frequency of Communication with Friends and  Family:   . Frequency of Social Gatherings with Friends and Family:   . Attends Religious Services:   . Active Member of Clubs or Organizations:   . Attends Archivist Meetings:   Marland Kitchen Marital Status:      Family History: The patient's family history includes Colon cancer in his mother. There is no history of CAD or Sudden Cardiac Death.  ROS:   Please see the history of present illness.    All other systems reviewed and are negative.  EKGs/Labs/Other Studies Reviewed:    The following studies were reviewed today: EKG revealed atrial fibrillation with fairly well-controlled ventricular rate and nonspecific ST-T changes.   Recent Labs: No results found for requested labs within last 8760  hours.  Recent Lipid Panel No results found for: CHOL, TRIG, HDL, CHOLHDL, VLDL, LDLCALC, LDLDIRECT  Physical Exam:    VS:  BP (!) 160/70   Pulse 90   Ht 5\' 11"  (1.803 m)   Wt 196 lb (88.9 kg)   SpO2 97%   BMI 27.34 kg/m     Wt Readings from Last 3 Encounters:  05/03/19 196 lb (88.9 kg)  10/28/18 201 lb (91.2 kg)  02/24/18 217 lb (98.4 kg)     GEN: Patient is in no acute distress HEENT: Normal NECK: No JVD; No carotid bruits LYMPHATICS: No lymphadenopathy CARDIAC: Hear sounds regular, 2/6 systolic murmur at the apex. RESPIRATORY:  Clear to auscultation without rales, wheezing or rhonchi  ABDOMEN: Soft, non-tender, non-distended MUSCULOSKELETAL:  No edema; No deformity  SKIN: Warm and dry NEUROLOGIC:  Alert and oriented x 3 PSYCHIATRIC:  Normal affect   Signed, Jenean Lindau, MD  05/03/2019 3:29 PM    [facility]

## 2019-05-03 NOTE — Patient Instructions (Signed)
Medication Instructions:  Your physician recommends that you continue on your current medications as directed. Please refer to the Current Medication list given to you today.  *If you need a refill on your cardiac medications before your next appointment, please call your pharmacy*   Lab Work: None.  If you have labs (blood work) drawn today and your tests are completely normal, you will receive your results only by: Marland Kitchen MyChart Message (if you have MyChart) OR . A paper copy in the mail If you have any lab test that is abnormal or we need to change your treatment, we will call you to review the results.   Testing/Procedures: None.    Follow-Up: At [facility], you and your health needs are our priority.  As part of our continuing mission to provide you with exceptional heart care, we have created designated Provider Care Teams.  These Care Teams include your primary Cardiologist (physician) and Advanced Practice Providers (APPs -  Physician Assistants and Nurse Practitioners) who all work together to provide you with the care you need, when you need it.  We recommend signing up for the patient portal called "MyChart".  Sign up information is provided on this After Visit Summary.  MyChart is used to connect with patients for Virtual Visits (Telemedicine).  Patients are able to view lab/test results, encounter notes, upcoming appointments, etc.  Non-urgent messages can be sent to your provider as well.   To learn more about what you can do with MyChart, go to [email or web address].    Your next appointment:   1 month(s)  The format for your next appointment:   In Person  Provider:   Jyl Heinz, MD   Other Instructions

## 2019-06-03 ENCOUNTER — Encounter: Payer: Self-pay | Admitting: Cardiology

## 2019-06-03 ENCOUNTER — Ambulatory Visit (INDEPENDENT_AMBULATORY_CARE_PROVIDER_SITE_OTHER): Payer: Medicare HMO | Admitting: Cardiology

## 2019-06-03 ENCOUNTER — Other Ambulatory Visit: Payer: Self-pay

## 2019-06-03 VITALS — BP 158/78 | HR 80 | Temp 98.5°F | Ht 70.5 in | Wt 189.0 lb

## 2019-06-03 DIAGNOSIS — Z9861 Coronary angioplasty status: Secondary | ICD-10-CM

## 2019-06-03 DIAGNOSIS — I2510 Atherosclerotic heart disease of native coronary artery without angina pectoris: Secondary | ICD-10-CM

## 2019-06-03 DIAGNOSIS — I712 Thoracic aortic aneurysm, without rupture, unspecified: Secondary | ICD-10-CM

## 2019-06-03 DIAGNOSIS — E782 Mixed hyperlipidemia: Secondary | ICD-10-CM

## 2019-06-03 DIAGNOSIS — Z952 Presence of prosthetic heart valve: Secondary | ICD-10-CM

## 2019-06-03 NOTE — Progress Notes (Signed)
Cardiology Office Note:    Date:  06/03/2019   ID:  Eric Perry, DOB 1940/09/12, MRN [id number]  PCP:  Greig Right, MD  Cardiologist:  Jenean Lindau, MD   Referring MD: Greig Right, MD    ASSESSMENT:    1. CAD S/P percutaneous coronary angioplasty   2. S/P TAVR (transcatheter aortic valve replacement)   3. Mixed hyperlipidemia    PLAN:    In order of problems listed above:  1. Coronary artery disease: Secondary prevention stressed with the patient.  Importance of compliance with diet and medication stressed and he vocalized understanding. 2. Whitecoat hypertension: His blood pressure is elevated at the doctor's office.  He checks it at home and it is fine. 3. Atrial fibrillation:I discussed with the patient atrial fibrillation, disease process. Management and therapy including rate and rhythm control, anticoagulation benefits and potential risks were discussed extensively with the patient. Patient had multiple questions which were answered to patient's satisfaction. 4. Post TAVR: Stable at this time echocardiogram was reviewed. 5. Ascending aortic aneurysm: He is due for annual follow-up and we will schedule him for a CT of the chest. 6. Mixed dyslipidemia: On statins.  He will be back in the next few days for fasting liver lipid check amongst other blood work 7. Patient will be seen in follow-up appointment in 6 months or earlier if the patient has any concerns    Medication Adjustments/Labs and Tests Ordered: Current medicines are reviewed at length with the patient today.  Concerns regarding medicines are outlined above.  No orders of the defined types were placed in this encounter.  No orders of the defined types were placed in this encounter.    No chief complaint on file.    History of Present Illness:    Eric Perry is a 79 y.o. male.  Patient has past medical history of aortic stenosis post TAVR surgery, ascending aortic dilatation, atrial  fibrillation and coronary artery disease.  He denies any problems at this time and takes care of activities of daily living.  No chest pain orthopnea or PND.  He ambulates with a cane on a regular basis.  At the time of my evaluation, the patient is alert awake oriented and in no distress.  Past Medical History:  Diagnosis Date  . Aortic stenosis 08/07/2017  . Ascending aorta dilatation ([place]) 05/03/2019  . Atrial fibrillation ([place]) 08/07/2017  . Atrial fibrillation, chronic (HCC)    a. on Eliquis  . CAD (coronary artery disease)    a. s/p remote stenting to LAD, CTO RCA with collaterals  . CAD S/P percutaneous coronary angioplasty 08/07/2017  . Hyperlipidemia   . Osteoarthritis    "was probably in my knees" (11/17/2017)  . Prostate cancer ([place])   . S/P TAVR (transcatheter aortic valve replacement)   . Severe aortic stenosis     Past Surgical History:  Procedure Laterality Date  . APPENDECTOMY    . CORONARY ANGIOPLASTY WITH STENT PLACEMENT  1990s  . INSERTION PROSTATE RADIATION SEED  ~ 2004  . INTRAOPERATIVE TRANSTHORACIC ECHOCARDIOGRAM N/A 11/17/2017   Procedure: INTRAOPERATIVE TRANSTHORACIC ECHOCARDIOGRAM;  Surgeon: Burnell Blanks, MD;  Location: [place];  Service: Open Heart Surgery;  Laterality: N/A;  . JOINT REPLACEMENT    . REPLACEMENT TOTAL KNEE BILATERAL Bilateral   . RIGHT/LEFT HEART CATH AND CORONARY ANGIOGRAPHY N/A 10/22/2017   Procedure: RIGHT/LEFT HEART CATH AND CORONARY ANGIOGRAPHY;  Surgeon: Burnell Blanks, MD;  Location: [place] [facility];  Service: Cardiovascular;  Laterality: N/A;  . SHOULDER ARTHROSCOPY W/ ROTATOR CUFF REPAIR Left 2018  . TRANSCATHETER AORTIC VALVE REPLACEMENT, TRANSFEMORAL  11/17/2017  . TRANSCATHETER AORTIC VALVE REPLACEMENT, TRANSFEMORAL N/A 11/17/2017   Procedure: TRANSCATHETER AORTIC VALVE REPLACEMENT, TRANSFEMORAL. 31mm Edwards SAPIEN 3 THV.;  Surgeon: Burnell Blanks, MD;  Location: [place];  Service: Open Heart Surgery;   Laterality: N/A;    Current Medications: Current Meds  Medication Sig  . apixaban (ELIQUIS) 5 MG TABS tablet Take 5 mg by mouth 2 (two) times daily.  . lansoprazole (PREVACID) 15 MG capsule Take 15 mg by mouth daily before breakfast.   . morphine (MSIR) 15 MG tablet Take 15 mg by mouth 3 (three) times daily as needed for severe pain.   . Omega-3 Fatty Acids (FISH OIL) 1000 MG CAPS Take 1,000 mg by mouth 2 (two) times daily.   Vladimir Faster Glycol-Propyl Glycol (LUBRICANT EYE DROPS) 0.4-0.3 % SOLN Place 1 drop into both eyes 3 (three) times daily as needed (for dry eyes).   . simvastatin (ZOCOR) 40 MG tablet Take 40 mg by mouth at bedtime.   . tamsulosin (FLOMAX) 0.4 MG CAPS capsule Take 0.4 mg by mouth daily.      Allergies:   Rocephin [ceftriaxone sodium in dextrose] and Versed [midazolam]   Social History   Socioeconomic History  . Marital status: Married    Spouse name: Not on file  . Number of children: 4  . Years of education: Not on file  . Highest education level: Not on file  Occupational History  . Occupation: Retired Therapist, nutritional, plant superviisor  Tobacco Use  . Smoking status: Former Smoker    Packs/day: 0.50    Years: 10.00    Pack years: 5.00    Types: Cigarettes    Quit date: 02/11/1960    Years since quitting: 59.3  . Smokeless tobacco: Never Used  Substance and Sexual Activity  . Alcohol use: Yes    Comment: 11/17/2017 "1 beer/month"  . Drug use: Never  . Sexual activity: Not Currently  Other Topics Concern  . Not on file  Social History Narrative  . Not on file   Social Determinants of Health   Financial Resource Strain:   . Difficulty of Paying Living Expenses:   Food Insecurity:   . Worried About Charity fundraiser in the Last Year:   . Arboriculturist in the Last Year:   Transportation Needs:   . Film/video editor (Medical):   Marland Kitchen Lack of Transportation (Non-Medical):   Physical Activity:   . Days of Exercise per Week:   . Minutes of  Exercise per Session:   Stress:   . Feeling of Stress :   Social Connections:   . Frequency of Communication with Friends and Family:   . Frequency of Social Gatherings with Friends and Family:   . Attends Religious Services:   . Active Member of Clubs or Organizations:   . Attends Archivist Meetings:   Marland Kitchen Marital Status:      Family History: The patient's family history includes Colon cancer in his mother. There is no history of CAD or Sudden Cardiac Death.  ROS:   Please see the history of present illness.    All other systems reviewed and are negative.  EKGs/Labs/Other Studies Reviewed:    The following studies were reviewed today: IMPRESSIONS    1. The left ventricle has mildly reduced systolic function, with an  ejection fraction of 45-50%. The cavity  size was mildly dilated. There is  concentric left ventricular hypertrophy. Left ventricular diastolic  Doppler parameters are consistent with  pseudonormalization. Left ventricular diffuse hypokinesis.  2. The right ventricle has normal systolic function. The cavity was  mildly enlarged. There is No increase in right ventricular wall thickness.  Right ventricular systolic pressure is normal with an estimated pressure  of 34.8 mmHg.  3. Severe Biatrial enlargement.  4. An Wende Crease Sapien bioprosthetic aortic valve (TAVR) valve is  present in the aortic position. Echo findings are consistent with mild  perivalvular leak of the aortic prosthesis. No stenosis of the  bioprosthesis.  5. Mild progressive mitral stenosis.  6. The pulmonic valve was grossly normal. Pulmonic valve regurgitation  was not assessed by color flow Doppler.  7. The aorta is normal unless otherwise noted.  8. Compare to prior TTE 12/14/2017 left ventricular ejection fraction is  slightly improved.    Recent Labs: No results found for requested labs within last 8760 hours.  Recent Lipid Panel No results found for: CHOL,  TRIG, HDL, CHOLHDL, VLDL, LDLCALC, LDLDIRECT  Physical Exam:    VS:  BP (!) 158/78   Pulse 80   Temp 98.5 F (36.9 C)   Ht 5' 10.5" (1.791 m)   Wt 189 lb (85.7 kg)   SpO2 94%   BMI 26.74 kg/m     Wt Readings from Last 3 Encounters:  06/03/19 189 lb (85.7 kg)  05/03/19 196 lb (88.9 kg)  10/28/18 201 lb (91.2 kg)     GEN: Patient is in no acute distress HEENT: Normal NECK: No JVD; No carotid bruits LYMPHATICS: No lymphadenopathy CARDIAC: Hear sounds regular, 2/6 systolic murmur at the apex. RESPIRATORY:  Clear to auscultation without rales, wheezing or rhonchi  ABDOMEN: Soft, non-tender, non-distended MUSCULOSKELETAL:  No edema; No deformity  SKIN: Warm and dry NEUROLOGIC:  Alert and oriented x 3 PSYCHIATRIC:  Normal affect   Signed, Jenean Lindau, MD  06/03/2019 2:43 PM    [place]

## 2019-06-03 NOTE — Patient Instructions (Signed)
Medication Instructions:  Nn medication changes *If you need a refill on your cardiac medications before your next appointment, please call your pharmacy*   Lab Work: Your physician recommends that you return for lab work in:the next few days.  You need to have labs done when you are fasting.  You can come Monday through Friday 8:30 am to 12:00 pm and 1:15 to 4:30. You do not need to make an appointment as the order has already been placed. The labs you are going to have done are BMET, CBC, TSH, LFT and Lipids.   If you have labs (blood work) drawn today and your tests are completely normal, you will receive your results only by: Marland Kitchen MyChart Message (if you have MyChart) OR . A paper copy in the mail If you have any lab test that is abnormal or we need to change your treatment, we will call you to review the results.   Testing/Procedures: Non-Cardiac CT Angiography (CTA), is a special type of CT scan that uses a computer to produce multi-dimensional views of major blood vessels throughout the body. In CT angiography, a contrast material is injected through an IV to help visualize the blood vessels   Follow-Up: At [facility], you and your health needs are our priority.  As part of our continuing mission to provide you with exceptional heart care, we have created designated Provider Care Teams.  These Care Teams include your primary Cardiologist (physician) and Advanced Practice Providers (APPs -  Physician Assistants and Nurse Practitioners) who all work together to provide you with the care you need, when you need it.  We recommend signing up for the patient portal called "MyChart".  Sign up information is provided on this After Visit Summary.  MyChart is used to connect with patients for Virtual Visits (Telemedicine).  Patients are able to view lab/test results, encounter notes, upcoming appointments, etc.  Non-urgent messages can be sent to your provider as well.   To learn more about  what you can do with MyChart, go to [email or web address].    Your next appointment:   6 month(s)  The format for your next appointment:   In Person  Provider:   Jyl Heinz, MD   Other Instructions NA

## 2019-06-14 DIAGNOSIS — E782 Mixed hyperlipidemia: Secondary | ICD-10-CM | POA: Diagnosis not present

## 2019-06-14 DIAGNOSIS — Z952 Presence of prosthetic heart valve: Secondary | ICD-10-CM | POA: Diagnosis not present

## 2019-06-14 DIAGNOSIS — Z9861 Coronary angioplasty status: Secondary | ICD-10-CM | POA: Diagnosis not present

## 2019-06-14 DIAGNOSIS — I2510 Atherosclerotic heart disease of native coronary artery without angina pectoris: Secondary | ICD-10-CM | POA: Diagnosis not present

## 2019-06-15 LAB — BASIC METABOLIC PANEL
BUN/Creatinine Ratio: 17 (ref 10–24)
BUN: 11 mg/dL (ref 8–27)
CO2: 26 mmol/L (ref 20–29)
Calcium: 9.8 mg/dL (ref 8.6–10.2)
Chloride: 100 mmol/L (ref 96–106)
Creatinine, Ser: 0.63 mg/dL — ABNORMAL LOW (ref 0.76–1.27)
GFR calc Af Amer: 108 mL/min/{1.73_m2} (ref >59)
GFR calc non Af Amer: 93 mL/min/{1.73_m2} (ref >59)
Glucose: 116 mg/dL — ABNORMAL HIGH (ref 65–99)
Potassium: 4 mmol/L (ref 3.5–5.2)
Sodium: 138 mmol/L (ref 134–144)

## 2019-06-15 LAB — CBC WITH DIFFERENTIAL/PLATELET
Basophils Absolute: 0 10*3/uL (ref 0.0–0.2)
Basos: 1 %
EOS (ABSOLUTE): 0.1 10*3/uL (ref 0.0–0.4)
Eos: 2 %
Hematocrit: 40.1 % (ref 37.5–51.0)
Hemoglobin: 11.9 g/dL — ABNORMAL LOW (ref 13.0–17.7)
Immature Grans (Abs): 0 10*3/uL (ref 0.0–0.1)
Immature Granulocytes: 0 %
Lymphocytes Absolute: 1.5 10*3/uL (ref 0.7–3.1)
Lymphs: 29 %
MCH: 23.8 pg — ABNORMAL LOW (ref 26.6–33.0)
MCHC: 29.7 g/dL — ABNORMAL LOW (ref 31.5–35.7)
MCV: 80 fL (ref 79–97)
Monocytes Absolute: 0.6 10*3/uL (ref 0.1–0.9)
Monocytes: 12 %
Neutrophils Absolute: 2.8 10*3/uL (ref 1.4–7.0)
Neutrophils: 56 %
Platelets: 167 10*3/uL (ref 150–450)
RBC: 5 x10E6/uL (ref 4.14–5.80)
RDW: 16.4 % — ABNORMAL HIGH (ref 11.6–15.4)
WBC: 5.1 10*3/uL (ref 3.4–10.8)

## 2019-06-15 LAB — HEPATIC FUNCTION PANEL
ALT: 8 IU/L (ref 0–44)
AST: 13 IU/L (ref 0–40)
Albumin: 4.2 g/dL (ref 3.7–4.7)
Alkaline Phosphatase: 91 IU/L (ref 39–117)
Bilirubin Total: 0.9 mg/dL (ref 0.0–1.2)
Bilirubin, Direct: 0.33 mg/dL (ref 0.00–0.40)
Total Protein: 6.8 g/dL (ref 6.0–8.5)

## 2019-06-15 LAB — LIPID PANEL
Chol/HDL Ratio: 2.4 ratio (ref 0.0–5.0)
Cholesterol, Total: 123 mg/dL (ref 100–199)
HDL: 52 mg/dL (ref >39)
LDL Chol Calc (NIH): 55 mg/dL (ref 0–99)
Triglycerides: 81 mg/dL (ref 0–149)
VLDL Cholesterol Cal: 16 mg/dL (ref 5–40)

## 2019-06-15 LAB — TSH: TSH: 1.78 u[IU]/mL (ref 0.450–4.500)

## 2019-06-15 LAB — MAGNESIUM: Magnesium: 1.8 mg/dL (ref 1.6–2.3)

## 2019-08-25 DIAGNOSIS — R55 Syncope and collapse: Secondary | ICD-10-CM | POA: Diagnosis not present

## 2019-08-25 DIAGNOSIS — I10 Essential (primary) hypertension: Secondary | ICD-10-CM | POA: Diagnosis not present

## 2019-08-25 DIAGNOSIS — I672 Cerebral atherosclerosis: Secondary | ICD-10-CM | POA: Diagnosis not present

## 2019-08-25 DIAGNOSIS — I517 Cardiomegaly: Secondary | ICD-10-CM | POA: Diagnosis not present

## 2019-08-25 DIAGNOSIS — I2510 Atherosclerotic heart disease of native coronary artery without angina pectoris: Secondary | ICD-10-CM | POA: Diagnosis not present

## 2019-08-25 DIAGNOSIS — Z87891 Personal history of nicotine dependence: Secondary | ICD-10-CM | POA: Diagnosis not present

## 2019-08-25 DIAGNOSIS — T675XXA Heat exhaustion, unspecified, initial encounter: Secondary | ICD-10-CM | POA: Diagnosis not present

## 2019-08-25 DIAGNOSIS — J90 Pleural effusion, not elsewhere classified: Secondary | ICD-10-CM | POA: Diagnosis not present

## 2019-08-25 DIAGNOSIS — G9389 Other specified disorders of brain: Secondary | ICD-10-CM | POA: Diagnosis not present

## 2019-08-25 DIAGNOSIS — I6389 Other cerebral infarction: Secondary | ICD-10-CM | POA: Diagnosis not present

## 2019-08-25 DIAGNOSIS — E7800 Pure hypercholesterolemia, unspecified: Secondary | ICD-10-CM | POA: Diagnosis not present

## 2019-08-25 DIAGNOSIS — R531 Weakness: Secondary | ICD-10-CM | POA: Diagnosis not present

## 2019-08-25 DIAGNOSIS — I6523 Occlusion and stenosis of bilateral carotid arteries: Secondary | ICD-10-CM | POA: Diagnosis not present

## 2019-09-12 DIAGNOSIS — I2510 Atherosclerotic heart disease of native coronary artery without angina pectoris: Secondary | ICD-10-CM | POA: Diagnosis not present

## 2019-09-12 DIAGNOSIS — Z79891 Long term (current) use of opiate analgesic: Secondary | ICD-10-CM | POA: Diagnosis not present

## 2019-09-12 DIAGNOSIS — M545 Low back pain: Secondary | ICD-10-CM | POA: Diagnosis not present

## 2019-09-12 DIAGNOSIS — Z7901 Long term (current) use of anticoagulants: Secondary | ICD-10-CM | POA: Diagnosis not present

## 2019-09-12 DIAGNOSIS — Z6827 Body mass index (BMI) 27.0-27.9, adult: Secondary | ICD-10-CM | POA: Diagnosis not present

## 2019-09-12 DIAGNOSIS — Z96652 Presence of left artificial knee joint: Secondary | ICD-10-CM | POA: Diagnosis not present

## 2019-09-12 DIAGNOSIS — G894 Chronic pain syndrome: Secondary | ICD-10-CM | POA: Diagnosis not present

## 2019-09-12 DIAGNOSIS — I714 Abdominal aortic aneurysm, without rupture: Secondary | ICD-10-CM | POA: Diagnosis not present

## 2019-09-12 DIAGNOSIS — I712 Thoracic aortic aneurysm, without rupture: Secondary | ICD-10-CM | POA: Diagnosis not present

## 2019-11-02 DIAGNOSIS — Z23 Encounter for immunization: Secondary | ICD-10-CM | POA: Diagnosis not present

## 2019-12-05 DIAGNOSIS — I482 Chronic atrial fibrillation, unspecified: Secondary | ICD-10-CM | POA: Insufficient documentation

## 2019-12-05 DIAGNOSIS — I2510 Atherosclerotic heart disease of native coronary artery without angina pectoris: Secondary | ICD-10-CM | POA: Insufficient documentation

## 2019-12-05 DIAGNOSIS — I350 Nonrheumatic aortic (valve) stenosis: Secondary | ICD-10-CM | POA: Insufficient documentation

## 2019-12-06 ENCOUNTER — Encounter: Payer: Self-pay | Admitting: Cardiology

## 2019-12-06 ENCOUNTER — Other Ambulatory Visit: Payer: Self-pay

## 2019-12-06 ENCOUNTER — Ambulatory Visit (INDEPENDENT_AMBULATORY_CARE_PROVIDER_SITE_OTHER): Payer: Medicare HMO | Admitting: Cardiology

## 2019-12-06 VITALS — BP 163/74 | HR 70 | Ht 70.5 in | Wt 190.0 lb

## 2019-12-06 DIAGNOSIS — I2510 Atherosclerotic heart disease of native coronary artery without angina pectoris: Secondary | ICD-10-CM | POA: Diagnosis not present

## 2019-12-06 DIAGNOSIS — Z9861 Coronary angioplasty status: Secondary | ICD-10-CM | POA: Diagnosis not present

## 2019-12-06 DIAGNOSIS — I4891 Unspecified atrial fibrillation: Secondary | ICD-10-CM | POA: Diagnosis not present

## 2019-12-06 DIAGNOSIS — I77810 Thoracic aortic ectasia: Secondary | ICD-10-CM

## 2019-12-06 DIAGNOSIS — Z952 Presence of prosthetic heart valve: Secondary | ICD-10-CM

## 2019-12-06 NOTE — Patient Instructions (Signed)
Medication Instructions:  No medication changes. *If you need a refill on your cardiac medications before your next appointment, please call your pharmacy*   Lab Work: Your physician recommends that you have a BMET and LFT today.  If you have labs (blood work) drawn today and your tests are completely normal, you will receive your results only by: Marland Kitchen MyChart Message (if you have MyChart) OR . A paper copy in the mail If you have any lab test that is abnormal or we need to change your treatment, we will call you to review the results.   Testing/Procedures: Your physician has requested that you have an echocardiogram. Echocardiography is a painless test that uses sound waves to create images of your heart. It provides your doctor with information about the size and shape of your heart and how well your heart's chambers and valves are working. This procedure takes approximately one hour. There are no restrictions for this procedure.     Follow-Up: At [facility], you and your health needs are our priority.  As part of our continuing mission to provide you with exceptional heart care, we have created designated Provider Care Teams.  These Care Teams include your primary Cardiologist (physician) and Advanced Practice Providers (APPs -  Physician Assistants and Nurse Practitioners) who all work together to provide you with the care you need, when you need it.  We recommend signing up for the patient portal called "MyChart".  Sign up information is provided on this After Visit Summary.  MyChart is used to connect with patients for Virtual Visits (Telemedicine).  Patients are able to view lab/test results, encounter notes, upcoming appointments, etc.  Non-urgent messages can be sent to your provider as well.   To learn more about what you can do with MyChart, go to [email or web address].    Your next appointment:   6 month(s)  The format for your next appointment:   In Person  Provider:     Jyl Heinz, MD   Other Instructions  Echocardiogram An echocardiogram is a procedure that uses painless sound waves (ultrasound) to produce an image of the heart. Images from an echocardiogram can provide important information about:  Signs of coronary artery disease (CAD).  Aneurysm detection. An aneurysm is a weak or damaged part of an artery wall that bulges out from the normal force of blood pumping through the body.  Heart size and shape. Changes in the size or shape of the heart can be associated with certain conditions, including heart failure, aneurysm, and CAD.  Heart muscle function.  Heart valve function.  Signs of a past heart attack.  Fluid buildup around the heart.  Thickening of the heart muscle.  A tumor or infectious growth around the heart valves. Tell a health care provider about:  Any allergies you have.  All medicines you are taking, including vitamins, herbs, eye drops, creams, and over-the-counter medicines.  Any blood disorders you have.  Any surgeries you have had.  Any medical conditions you have.  Whether you are pregnant or may be pregnant. What are the risks? Generally, this is a safe procedure. However, problems may occur, including:  Allergic reaction to dye (contrast) that may be used during the procedure. What happens before the procedure? No specific preparation is needed. You may eat and drink normally. What happens during the procedure?   An IV tube may be inserted into one of your veins.  You may receive contrast through this tube. A contrast is an injection that  improves the quality of the pictures from your heart.  A gel will be applied to your chest.  A wand-like tool (transducer) will be moved over your chest. The gel will help to transmit the sound waves from the transducer.  The sound waves will harmlessly bounce off of your heart to allow the heart images to be captured in real-time motion. The images will be  recorded on a computer. The procedure may vary among health care providers and hospitals. What happens after the procedure?  You may return to your normal, everyday life, including diet, activities, and medicines, unless your health care provider tells you not to do that. Summary  An echocardiogram is a procedure that uses painless sound waves (ultrasound) to produce an image of the heart.  Images from an echocardiogram can provide important information about the size and shape of your heart, heart muscle function, heart valve function, and fluid buildup around your heart.  You do not need to do anything to prepare before this procedure. You may eat and drink normally.  After the echocardiogram is completed, you may return to your normal, everyday life, unless your health care provider tells you not to do that. This information is not intended to replace advice given to you by your health care provider. Make sure you discuss any questions you have with your health care provider. Document Revised: 05/20/2018 Document Reviewed: 03/01/2016 Elsevier Patient Education  [place].

## 2019-12-06 NOTE — Addendum Note (Signed)
Addended by: Truddie Hidden on: 12/06/2019 01:52 PM   Modules accepted: Orders

## 2019-12-06 NOTE — Progress Notes (Signed)
Cardiology Office Note:    Date:  12/06/2019   ID:  KEVONTAE BURGOON, DOB Nov 26, 1940, MRN [id number]  PCP:  Greig Right, MD  Cardiologist:  Jenean Lindau, MD   Referring MD: Greig Right, MD    ASSESSMENT:    1. Ascending aorta dilatation (HCC)   2. S/P TAVR (transcatheter aortic valve replacement)   3. CAD S/P percutaneous coronary angioplasty   4. Atrial fibrillation, unspecified type ([place])    PLAN:    In order of problems listed above:  1. Coronary artery disease: Secondary prevention stressed with the patient.  Importance of compliance with diet medication stressed and he vocalized understanding.  He takes good care of himself and ambulates age appropriately and based on his orthopedic challenges he does well. 2. Atrial fibrillation:I discussed with the patient atrial fibrillation, disease process. Management and therapy including rate and rhythm control, anticoagulation benefits and potential risks were discussed extensively with the patient. Patient had multiple questions which were answered to patient's satisfaction. 3. Post TAVR: Stable echocardiogram will be done to assess this and is in follow-up. 4. Mixed dyslipidemia: On statin therapy and lipids followed by primary care physician.  We will have a Chem-7 and liver panel today. 5. Patient will be seen in follow-up appointment in 6 months or earlier if the patient has any concerns    Medication Adjustments/Labs and Tests Ordered: Current medicines are reviewed at length with the patient today.  Concerns regarding medicines are outlined above.  No orders of the defined types were placed in this encounter.  No orders of the defined types were placed in this encounter.    No chief complaint on file.    History of Present Illness:    Eric Perry is a 79 y.o. male.  Patient has past medical history of atrial fibrillation, coronary artery disease and post TAVR procedure.  He denies any problems at this  time and takes care of activities of daily living.  No chest pain orthopnea or PND.  At the time of my evaluation, the patient is alert awake oriented and in no distress.  He ambulates with a cane.  Past Medical History:  Diagnosis Date  . Aortic stenosis 08/07/2017  . Ascending aorta dilatation ([place]) 05/03/2019  . Atrial fibrillation ([place]) 08/07/2017  . Atrial fibrillation, chronic (HCC)    a. on Eliquis  . CAD (coronary artery disease)    a. s/p remote stenting to LAD, CTO RCA with collaterals  . CAD S/P percutaneous coronary angioplasty 08/07/2017  . Hyperlipidemia   . Osteoarthritis    "was probably in my knees" (11/17/2017)  . Prostate cancer ([place])   . S/P TAVR (transcatheter aortic valve replacement)   . Severe aortic stenosis     Past Surgical History:  Procedure Laterality Date  . APPENDECTOMY    . CORONARY ANGIOPLASTY WITH STENT PLACEMENT  1990s  . INSERTION PROSTATE RADIATION SEED  ~ 2004  . INTRAOPERATIVE TRANSTHORACIC ECHOCARDIOGRAM N/A 11/17/2017   Procedure: INTRAOPERATIVE TRANSTHORACIC ECHOCARDIOGRAM;  Surgeon: Burnell Blanks, MD;  Location: [place];  Service: Open Heart Surgery;  Laterality: N/A;  . JOINT REPLACEMENT    . REPLACEMENT TOTAL KNEE BILATERAL Bilateral   . RIGHT/LEFT HEART CATH AND CORONARY ANGIOGRAPHY N/A 10/22/2017   Procedure: RIGHT/LEFT HEART CATH AND CORONARY ANGIOGRAPHY;  Surgeon: Burnell Blanks, MD;  Location: [place] [facility];  Service: Cardiovascular;  Laterality: N/A;  . SHOULDER ARTHROSCOPY W/ ROTATOR CUFF REPAIR Left 2018  . TRANSCATHETER AORTIC VALVE REPLACEMENT,  TRANSFEMORAL  11/17/2017  . TRANSCATHETER AORTIC VALVE REPLACEMENT, TRANSFEMORAL N/A 11/17/2017   Procedure: TRANSCATHETER AORTIC VALVE REPLACEMENT, TRANSFEMORAL. 56mm Edwards SAPIEN 3 THV.;  Surgeon: Burnell Blanks, MD;  Location: [place];  Service: Open Heart Surgery;  Laterality: N/A;    Current Medications: Current Meds  Medication Sig  . apixaban (ELIQUIS)  5 MG TABS tablet Take 5 mg by mouth 2 (two) times daily.  . lansoprazole (PREVACID) 15 MG capsule Take 15 mg by mouth daily before breakfast.   . morphine (MSIR) 15 MG tablet Take 15 mg by mouth 3 (three) times daily as needed for severe pain.   . Omega-3 Fatty Acids (FISH OIL) 1000 MG CAPS Take 1,000 mg by mouth 2 (two) times daily.   Vladimir Faster Glycol-Propyl Glycol (LUBRICANT EYE DROPS) 0.4-0.3 % SOLN Place 1 drop into both eyes 3 (three) times daily as needed (for dry eyes).   . simvastatin (ZOCOR) 40 MG tablet Take 40 mg by mouth at bedtime.   . tamsulosin (FLOMAX) 0.4 MG CAPS capsule Take 0.4 mg by mouth daily.      Allergies:   Rocephin [ceftriaxone sodium in dextrose] and Versed [midazolam]   Social History   Socioeconomic History  . Marital status: Married    Spouse name: Not on file  . Number of children: 4  . Years of education: Not on file  . Highest education level: Not on file  Occupational History  . Occupation: Retired Therapist, nutritional, plant superviisor  Tobacco Use  . Smoking status: Former Smoker    Packs/day: 0.50    Years: 10.00    Pack years: 5.00    Types: Cigarettes    Quit date: 02/11/1960    Years since quitting: 59.8  . Smokeless tobacco: Never Used  Vaping Use  . Vaping Use: Never used  Substance and Sexual Activity  . Alcohol use: Yes    Comment: 11/17/2017 "1 beer/month"  . Drug use: Never  . Sexual activity: Not Currently  Other Topics Concern  . Not on file  Social History Narrative  . Not on file   Social Determinants of Health   Financial Resource Strain:   . Difficulty of Paying Living Expenses: Not on file  Food Insecurity:   . Worried About Charity fundraiser in the Last Year: Not on file  . Ran Out of Food in the Last Year: Not on file  Transportation Needs:   . Lack of Transportation (Medical): Not on file  . Lack of Transportation (Non-Medical): Not on file  Physical Activity:   . Days of Exercise per Week: Not on file  .  Minutes of Exercise per Session: Not on file  Stress:   . Feeling of Stress : Not on file  Social Connections:   . Frequency of Communication with Friends and Family: Not on file  . Frequency of Social Gatherings with Friends and Family: Not on file  . Attends Religious Services: Not on file  . Active Member of Clubs or Organizations: Not on file  . Attends Archivist Meetings: Not on file  . Marital Status: Not on file     Family History: The patient's family history includes Colon cancer in his mother. There is no history of CAD or Sudden Cardiac Death.  ROS:   Please see the history of present illness.    All other systems reviewed and are negative.  EKGs/Labs/Other Studies Reviewed:    The following studies were reviewed today: I discussed my  findings with the patient at length.   Recent Labs: 06/14/2019: ALT 8; BUN 11; Creatinine, Ser 0.63; Hemoglobin 11.9; Magnesium 1.8; Platelets 167; Potassium 4.0; Sodium 138; TSH 1.780  Recent Lipid Panel    Component Value Date/Time   CHOL 123 06/14/2019 1342   TRIG 81 06/14/2019 1342   HDL 52 06/14/2019 1342   CHOLHDL 2.4 06/14/2019 1342   LDLCALC 55 06/14/2019 1342    Physical Exam:    VS:  BP (!) 163/74   Pulse 70   Ht 5' 10.5" (1.791 m)   Wt 190 lb (86.2 kg)   SpO2 97%   BMI 26.88 kg/m     Wt Readings from Last 3 Encounters:  12/06/19 190 lb (86.2 kg)  06/03/19 189 lb (85.7 kg)  05/03/19 196 lb (88.9 kg)     GEN: Patient is in no acute distress HEENT: Normal NECK: No JVD; No carotid bruits LYMPHATICS: No lymphadenopathy CARDIAC: Hear sounds regular, 2/6 systolic murmur at the apex. RESPIRATORY:  Clear to auscultation without rales, wheezing or rhonchi  ABDOMEN: Soft, non-tender, non-distended MUSCULOSKELETAL:  No edema; No deformity  SKIN: Warm and dry NEUROLOGIC:  Alert and oriented x 3 PSYCHIATRIC:  Normal affect   Signed, Jenean Lindau, MD  12/06/2019 1:37 PM    [facility]

## 2019-12-07 LAB — BASIC METABOLIC PANEL
BUN/Creatinine Ratio: 18 (ref 10–24)
BUN: 11 mg/dL (ref 8–27)
CO2: 27 mmol/L (ref 20–29)
Calcium: 9.4 mg/dL (ref 8.6–10.2)
Chloride: 101 mmol/L (ref 96–106)
Creatinine, Ser: 0.62 mg/dL — ABNORMAL LOW (ref 0.76–1.27)
GFR calc Af Amer: 108 mL/min/{1.73_m2} (ref >59)
GFR calc non Af Amer: 93 mL/min/{1.73_m2} (ref >59)
Glucose: 95 mg/dL (ref 65–99)
Potassium: 4.6 mmol/L (ref 3.5–5.2)
Sodium: 140 mmol/L (ref 134–144)

## 2019-12-07 LAB — HEPATIC FUNCTION PANEL
ALT: 7 IU/L (ref 0–44)
AST: 13 IU/L (ref 0–40)
Albumin: 4.3 g/dL (ref 3.6–4.6)
Alkaline Phosphatase: 90 IU/L (ref 44–121)
Bilirubin Total: 1.1 mg/dL (ref 0.0–1.2)
Bilirubin, Direct: 0.36 mg/dL (ref 0.00–0.40)
Total Protein: 6.9 g/dL (ref 6.0–8.5)

## 2019-12-22 ENCOUNTER — Other Ambulatory Visit: Payer: Medicare HMO

## 2019-12-26 ENCOUNTER — Other Ambulatory Visit: Payer: Medicare HMO

## 2020-01-23 ENCOUNTER — Ambulatory Visit (INDEPENDENT_AMBULATORY_CARE_PROVIDER_SITE_OTHER): Payer: Medicare HMO

## 2020-01-23 ENCOUNTER — Other Ambulatory Visit: Payer: Self-pay

## 2020-01-23 DIAGNOSIS — Z952 Presence of prosthetic heart valve: Secondary | ICD-10-CM | POA: Diagnosis not present

## 2020-01-23 DIAGNOSIS — Z9861 Coronary angioplasty status: Secondary | ICD-10-CM

## 2020-01-23 DIAGNOSIS — I77810 Thoracic aortic ectasia: Secondary | ICD-10-CM

## 2020-01-23 DIAGNOSIS — I4891 Unspecified atrial fibrillation: Secondary | ICD-10-CM

## 2020-01-23 DIAGNOSIS — I2510 Atherosclerotic heart disease of native coronary artery without angina pectoris: Secondary | ICD-10-CM

## 2020-01-23 LAB — ECHOCARDIOGRAM COMPLETE
AR max vel: 1.42 cm2
AV Area VTI: 1.34 cm2
AV Area mean vel: 1.42 cm2
AV Mean grad: 15 mmHg
AV Peak grad: 31.5 mmHg
Ao pk vel: 2.81 m/s
Area-P 1/2: 3.42 cm2
Calc EF: 51.3 %
P 1/2 time: 416 msec
S' Lateral: 5.1 cm
Single Plane A2C EF: 54 %
Single Plane A4C EF: 49.2 %

## 2020-01-23 NOTE — Progress Notes (Signed)
Complete echocardiogram performed.  Jimmy Winefred Hillesheim RDCS, RVT  

## 2020-06-07 ENCOUNTER — Ambulatory Visit: Payer: Medicare HMO | Admitting: Cardiology

## 2020-06-27 ENCOUNTER — Ambulatory Visit: Payer: Medicare HMO | Admitting: Cardiology

## 2020-08-17 ENCOUNTER — Other Ambulatory Visit: Payer: Self-pay

## 2020-08-17 ENCOUNTER — Ambulatory Visit (INDEPENDENT_AMBULATORY_CARE_PROVIDER_SITE_OTHER): Payer: Medicare HMO | Admitting: Cardiology

## 2020-08-17 ENCOUNTER — Encounter: Payer: Self-pay | Admitting: Cardiology

## 2020-08-17 VITALS — BP 162/64 | HR 83 | Ht 70.0 in | Wt 191.0 lb

## 2020-08-17 DIAGNOSIS — I2510 Atherosclerotic heart disease of native coronary artery without angina pectoris: Secondary | ICD-10-CM | POA: Diagnosis not present

## 2020-08-17 DIAGNOSIS — I77810 Thoracic aortic ectasia: Secondary | ICD-10-CM | POA: Diagnosis not present

## 2020-08-17 DIAGNOSIS — Z952 Presence of prosthetic heart valve: Secondary | ICD-10-CM | POA: Diagnosis not present

## 2020-08-17 NOTE — Patient Instructions (Signed)

## 2020-08-17 NOTE — Progress Notes (Signed)
Cardiology Office Note:    Date:  08/17/2020   ID:  Eric Perry, DOB November 12, 1940, MRN [id number]  PCP:  Greig Right, MD  Cardiologist:  Jenean Lindau, MD   Referring MD: Greig Right, MD    ASSESSMENT:    1. Coronary artery disease involving native coronary artery of native heart without angina pectoris   2. Ascending aorta dilatation (HCC)   3. S/P TAVR (transcatheter aortic valve replacement)    PLAN:    In order of problems listed above:  Coronary artery disease: Secondary prevention stressed with the patient.  Importance of compliance with diet medication stressed and he vocalized understanding. Essential hypertension: Blood pressure is mildly elevated.  He has an element of whitecoat hypertension.  He gives history symptoms of postural hypotension-like features so I would not try to control blood pressure aggressively.  He will keep a blood pressure log at home and bring it to me. Post TAVR: Echocardiogram was fine and discussed with patient. Ascending aortic aneurysm: We will do a CT scan to follow-up on this 1 and monitored closely. Mixed dyslipidemia: On statin therapy and will monitor.  Lipids.  These are followed by primary care.  He had blood work done at the [place] and he will drop off a copy of this. Patient will be seen in follow-up appointment in 6 months or earlier if the patient has any concerns    Medication Adjustments/Labs and Tests Ordered: Current medicines are reviewed at length with the patient today.  Concerns regarding medicines are outlined above.  No orders of the defined types were placed in this encounter.  No orders of the defined types were placed in this encounter.    No chief complaint on file.    History of Present Illness:    Eric Perry is a 80 y.o. male.  Patient has past medical history of essential hypertension, dyslipidemia, ascending aortic dilatation and post TAVR for aortic valve stenosis.  He denies any problems at  this time and takes care of activities of daily living.  No chest pain orthopnea or PND.  At the time of my evaluation, the patient is alert awake oriented and in no distress.  Past Medical History:  Diagnosis Date   Aortic stenosis 08/07/2017   Ascending aorta dilatation (HCC) 05/03/2019   Atrial fibrillation ([place]) 08/07/2017   Atrial fibrillation, chronic (HCC)    a. on Eliquis   CAD (coronary artery disease)    a. s/p remote stenting to LAD, CTO RCA with collaterals   CAD S/P percutaneous coronary angioplasty 08/07/2017   Hyperlipidemia    Osteoarthritis    "was probably in my knees" (11/17/2017)   Prostate cancer ([place])    S/P TAVR (transcatheter aortic valve replacement)    Severe aortic stenosis     Past Surgical History:  Procedure Laterality Date   APPENDECTOMY     CORONARY ANGIOPLASTY WITH STENT PLACEMENT  1990s   INSERTION PROSTATE RADIATION SEED  ~ 2004   INTRAOPERATIVE TRANSTHORACIC ECHOCARDIOGRAM N/A 11/17/2017   Procedure: INTRAOPERATIVE TRANSTHORACIC ECHOCARDIOGRAM;  Surgeon: Burnell Blanks, MD;  Location: [place];  Service: Open Heart Surgery;  Laterality: N/A;   JOINT REPLACEMENT     REPLACEMENT TOTAL KNEE BILATERAL Bilateral    RIGHT/LEFT HEART CATH AND CORONARY ANGIOGRAPHY N/A 10/22/2017   Procedure: RIGHT/LEFT HEART CATH AND CORONARY ANGIOGRAPHY;  Surgeon: Burnell Blanks, MD;  Location: [place] [facility];  Service: Cardiovascular;  Laterality: N/A;   SHOULDER ARTHROSCOPY W/ ROTATOR CUFF REPAIR  Left 2018   TRANSCATHETER AORTIC VALVE REPLACEMENT, TRANSFEMORAL  11/17/2017   TRANSCATHETER AORTIC VALVE REPLACEMENT, TRANSFEMORAL N/A 11/17/2017   Procedure: TRANSCATHETER AORTIC VALVE REPLACEMENT, TRANSFEMORAL. 62mm Edwards SAPIEN 3 THV.;  Surgeon: Burnell Blanks, MD;  Location: [place];  Service: Open Heart Surgery;  Laterality: N/A;    Current Medications: Current Meds  Medication Sig   apixaban (ELIQUIS) 5 MG TABS tablet Take 5 mg by mouth 2 (two)  times daily.   lansoprazole (PREVACID) 15 MG capsule Take 15 mg by mouth daily before breakfast.    morphine (MSIR) 15 MG tablet Take 15 mg by mouth 3 (three) times daily as needed for severe pain.    Omega-3 Fatty Acids (FISH OIL) 1000 MG CAPS Take 1,000 mg by mouth 2 (two) times daily.    Polyethyl Glycol-Propyl Glycol 0.4-0.3 % SOLN Place 1 drop into both eyes 3 (three) times daily as needed (for dry eyes).    simvastatin (ZOCOR) 40 MG tablet Take 40 mg by mouth at bedtime.    tamsulosin (FLOMAX) 0.4 MG CAPS capsule Take 0.4 mg by mouth daily.      Allergies:   Rocephin [ceftriaxone sodium in dextrose] and Versed [midazolam]   Social History   Socioeconomic History   Marital status: Married    Spouse name: Not on file   Number of children: 4   Years of education: Not on file   Highest education level: Not on file  Occupational History   Occupation: Retired Therapist, nutritional, plant superviisor  Tobacco Use   Smoking status: Former    Packs/day: 0.50    Years: 10.00    Pack years: 5.00    Types: Cigarettes    Quit date: 02/11/1960    Years since quitting: 60.5   Smokeless tobacco: Never  Vaping Use   Vaping Use: Never used  Substance and Sexual Activity   Alcohol use: Yes    Comment: 11/17/2017 "1 beer/month"   Drug use: Never   Sexual activity: Not Currently  Other Topics Concern   Not on file  Social History Narrative   Not on file   Social Determinants of Health   Financial Resource Strain: Not on file  Food Insecurity: Not on file  Transportation Needs: Not on file  Physical Activity: Not on file  Stress: Not on file  Social Connections: Not on file     Family History: The patient's family history includes Colon cancer in his mother. There is no history of CAD or Sudden Cardiac Death.  ROS:   Please see the history of present illness.    All other systems reviewed and are negative.  EKGs/Labs/Other Studies Reviewed:    The following studies were reviewed  today: EKG reveals atrial fibrillation with well-controlled ventricular rate.   Recent Labs: 12/06/2019: ALT 7; BUN 11; Creatinine, Ser 0.62; Potassium 4.6; Sodium 140  Recent Lipid Panel    Component Value Date/Time   CHOL 123 06/14/2019 1342   TRIG 81 06/14/2019 1342   HDL 52 06/14/2019 1342   CHOLHDL 2.4 06/14/2019 1342   LDLCALC 55 06/14/2019 1342    Physical Exam:    VS:  Ht 5\' 10"  (1.778 m)   Wt 191 lb (86.6 kg)   BMI 27.41 kg/m     Wt Readings from Last 3 Encounters:  08/17/20 191 lb (86.6 kg)  12/06/19 190 lb (86.2 kg)  06/03/19 189 lb (85.7 kg)     GEN: Patient is in no acute distress HEENT: Normal NECK: No  JVD; No carotid bruits LYMPHATICS: No lymphadenopathy CARDIAC: Hear sounds regular, 2/6 systolic murmur at the apex. RESPIRATORY:  Clear to auscultation without rales, wheezing or rhonchi  ABDOMEN: Soft, non-tender, non-distended MUSCULOSKELETAL:  No edema; No deformity  SKIN: Warm and dry NEUROLOGIC:  Alert and oriented x 3 PSYCHIATRIC:  Normal affect   Signed, Jenean Lindau, MD  08/17/2020 3:25 PM    [facility]

## 2020-11-26 DIAGNOSIS — Z23 Encounter for immunization: Secondary | ICD-10-CM | POA: Diagnosis not present

## 2020-12-12 DIAGNOSIS — N3000 Acute cystitis without hematuria: Secondary | ICD-10-CM | POA: Diagnosis not present

## 2020-12-12 DIAGNOSIS — R300 Dysuria: Secondary | ICD-10-CM | POA: Diagnosis not present

## 2020-12-24 DIAGNOSIS — R300 Dysuria: Secondary | ICD-10-CM | POA: Diagnosis not present

## 2021-03-08 DIAGNOSIS — J309 Allergic rhinitis, unspecified: Secondary | ICD-10-CM | POA: Insufficient documentation

## 2021-03-08 DIAGNOSIS — N400 Enlarged prostate without lower urinary tract symptoms: Secondary | ICD-10-CM | POA: Insufficient documentation

## 2021-03-08 DIAGNOSIS — B9689 Other specified bacterial agents as the cause of diseases classified elsewhere: Secondary | ICD-10-CM | POA: Diagnosis not present

## 2021-03-08 DIAGNOSIS — I10 Essential (primary) hypertension: Secondary | ICD-10-CM

## 2021-03-08 DIAGNOSIS — F5221 Male erectile disorder: Secondary | ICD-10-CM | POA: Insufficient documentation

## 2021-03-08 DIAGNOSIS — R300 Dysuria: Secondary | ICD-10-CM | POA: Diagnosis not present

## 2021-03-08 DIAGNOSIS — J329 Chronic sinusitis, unspecified: Secondary | ICD-10-CM | POA: Diagnosis not present

## 2021-03-08 DIAGNOSIS — H903 Sensorineural hearing loss, bilateral: Secondary | ICD-10-CM | POA: Insufficient documentation

## 2021-03-08 DIAGNOSIS — K219 Gastro-esophageal reflux disease without esophagitis: Secondary | ICD-10-CM | POA: Insufficient documentation

## 2021-03-08 DIAGNOSIS — R7303 Prediabetes: Secondary | ICD-10-CM | POA: Insufficient documentation

## 2021-03-08 DIAGNOSIS — H3562 Retinal hemorrhage, left eye: Secondary | ICD-10-CM

## 2021-03-08 DIAGNOSIS — C61 Malignant neoplasm of prostate: Secondary | ICD-10-CM | POA: Insufficient documentation

## 2021-03-08 DIAGNOSIS — W19XXXA Unspecified fall, initial encounter: Secondary | ICD-10-CM | POA: Diagnosis not present

## 2021-03-08 HISTORY — DX: Benign prostatic hyperplasia without lower urinary tract symptoms: N40.0

## 2021-03-08 HISTORY — DX: Sensorineural hearing loss, bilateral: H90.3

## 2021-03-08 HISTORY — DX: Allergic rhinitis, unspecified: J30.9

## 2021-03-08 HISTORY — DX: Gastro-esophageal reflux disease without esophagitis: K21.9

## 2021-03-08 HISTORY — DX: Male erectile disorder: F52.21

## 2021-03-08 HISTORY — DX: Malignant neoplasm of prostate: C61

## 2021-03-08 HISTORY — DX: Retinal hemorrhage, left eye: H35.62

## 2021-03-08 HISTORY — DX: Prediabetes: R73.03

## 2021-03-08 HISTORY — DX: Essential (primary) hypertension: I10

## 2021-03-09 DIAGNOSIS — R0602 Shortness of breath: Secondary | ICD-10-CM | POA: Diagnosis not present

## 2021-03-09 DIAGNOSIS — J90 Pleural effusion, not elsewhere classified: Secondary | ICD-10-CM | POA: Diagnosis not present

## 2021-03-09 DIAGNOSIS — I4891 Unspecified atrial fibrillation: Secondary | ICD-10-CM | POA: Diagnosis not present

## 2021-03-09 DIAGNOSIS — I509 Heart failure, unspecified: Secondary | ICD-10-CM | POA: Diagnosis not present

## 2021-03-09 DIAGNOSIS — I110 Hypertensive heart disease with heart failure: Secondary | ICD-10-CM | POA: Diagnosis not present

## 2021-03-09 DIAGNOSIS — R7989 Other specified abnormal findings of blood chemistry: Secondary | ICD-10-CM | POA: Diagnosis not present

## 2021-03-09 DIAGNOSIS — R531 Weakness: Secondary | ICD-10-CM | POA: Diagnosis not present

## 2021-03-09 DIAGNOSIS — I517 Cardiomegaly: Secondary | ICD-10-CM | POA: Diagnosis not present

## 2021-03-10 DIAGNOSIS — A419 Sepsis, unspecified organism: Secondary | ICD-10-CM | POA: Diagnosis not present

## 2021-03-10 DIAGNOSIS — I509 Heart failure, unspecified: Secondary | ICD-10-CM | POA: Diagnosis not present

## 2021-03-10 DIAGNOSIS — J189 Pneumonia, unspecified organism: Secondary | ICD-10-CM | POA: Diagnosis not present

## 2021-03-10 DIAGNOSIS — E871 Hypo-osmolality and hyponatremia: Secondary | ICD-10-CM | POA: Diagnosis not present

## 2021-03-10 DIAGNOSIS — I110 Hypertensive heart disease with heart failure: Secondary | ICD-10-CM | POA: Diagnosis not present

## 2021-03-10 DIAGNOSIS — R531 Weakness: Secondary | ICD-10-CM | POA: Diagnosis not present

## 2021-03-10 DIAGNOSIS — I352 Nonrheumatic aortic (valve) stenosis with insufficiency: Secondary | ICD-10-CM | POA: Diagnosis not present

## 2021-03-10 DIAGNOSIS — R0602 Shortness of breath: Secondary | ICD-10-CM | POA: Diagnosis not present

## 2021-03-10 DIAGNOSIS — R7989 Other specified abnormal findings of blood chemistry: Secondary | ICD-10-CM | POA: Diagnosis not present

## 2021-03-10 DIAGNOSIS — I5031 Acute diastolic (congestive) heart failure: Secondary | ICD-10-CM | POA: Diagnosis not present

## 2021-03-10 DIAGNOSIS — J9601 Acute respiratory failure with hypoxia: Secondary | ICD-10-CM | POA: Diagnosis not present

## 2021-03-10 DIAGNOSIS — I517 Cardiomegaly: Secondary | ICD-10-CM | POA: Diagnosis not present

## 2021-03-10 DIAGNOSIS — I4891 Unspecified atrial fibrillation: Secondary | ICD-10-CM | POA: Diagnosis not present

## 2021-03-10 DIAGNOSIS — S22089A Unspecified fracture of T11-T12 vertebra, initial encounter for closed fracture: Secondary | ICD-10-CM | POA: Diagnosis not present

## 2021-03-10 DIAGNOSIS — J90 Pleural effusion, not elsewhere classified: Secondary | ICD-10-CM | POA: Diagnosis not present

## 2021-03-11 DIAGNOSIS — I5031 Acute diastolic (congestive) heart failure: Secondary | ICD-10-CM | POA: Diagnosis not present

## 2021-03-11 DIAGNOSIS — A419 Sepsis, unspecified organism: Secondary | ICD-10-CM | POA: Diagnosis not present

## 2021-03-11 DIAGNOSIS — J189 Pneumonia, unspecified organism: Secondary | ICD-10-CM | POA: Diagnosis not present

## 2021-03-12 DIAGNOSIS — I5031 Acute diastolic (congestive) heart failure: Secondary | ICD-10-CM | POA: Diagnosis not present

## 2021-03-12 DIAGNOSIS — J189 Pneumonia, unspecified organism: Secondary | ICD-10-CM | POA: Diagnosis not present

## 2021-03-12 DIAGNOSIS — A419 Sepsis, unspecified organism: Secondary | ICD-10-CM | POA: Diagnosis not present

## 2021-03-13 DIAGNOSIS — J189 Pneumonia, unspecified organism: Secondary | ICD-10-CM | POA: Diagnosis not present

## 2021-03-13 DIAGNOSIS — I5031 Acute diastolic (congestive) heart failure: Secondary | ICD-10-CM | POA: Diagnosis not present

## 2021-03-13 DIAGNOSIS — A419 Sepsis, unspecified organism: Secondary | ICD-10-CM | POA: Diagnosis not present

## 2021-03-14 DIAGNOSIS — I5031 Acute diastolic (congestive) heart failure: Secondary | ICD-10-CM | POA: Diagnosis not present

## 2021-03-14 DIAGNOSIS — Z461 Encounter for fitting and adjustment of hearing aid: Secondary | ICD-10-CM | POA: Insufficient documentation

## 2021-03-14 DIAGNOSIS — I10 Essential (primary) hypertension: Secondary | ICD-10-CM | POA: Insufficient documentation

## 2021-03-14 DIAGNOSIS — Z658 Other specified problems related to psychosocial circumstances: Secondary | ICD-10-CM | POA: Insufficient documentation

## 2021-03-14 DIAGNOSIS — Z7189 Other specified counseling: Secondary | ICD-10-CM | POA: Insufficient documentation

## 2021-03-14 DIAGNOSIS — M545 Low back pain, unspecified: Secondary | ICD-10-CM | POA: Insufficient documentation

## 2021-03-14 DIAGNOSIS — J189 Pneumonia, unspecified organism: Secondary | ICD-10-CM | POA: Diagnosis not present

## 2021-03-14 DIAGNOSIS — Z800 Family history of malignant neoplasm of digestive organs: Secondary | ICD-10-CM | POA: Insufficient documentation

## 2021-03-14 DIAGNOSIS — A419 Sepsis, unspecified organism: Secondary | ICD-10-CM | POA: Diagnosis not present

## 2021-03-14 HISTORY — DX: Other specified problems related to psychosocial circumstances: Z65.8

## 2021-03-14 HISTORY — DX: Low back pain, unspecified: M54.50

## 2021-03-14 HISTORY — DX: Encounter for fitting and adjustment of hearing aid: Z46.1

## 2021-03-14 HISTORY — DX: Other specified counseling: Z71.89

## 2021-03-14 HISTORY — DX: Family history of malignant neoplasm of digestive organs: Z80.0

## 2021-03-14 HISTORY — DX: Essential (primary) hypertension: I10

## 2021-03-15 ENCOUNTER — Ambulatory Visit: Payer: Medicare HMO | Admitting: Cardiology

## 2021-03-20 DIAGNOSIS — R7989 Other specified abnormal findings of blood chemistry: Secondary | ICD-10-CM | POA: Diagnosis not present

## 2021-03-20 DIAGNOSIS — S22080D Wedge compression fracture of T11-T12 vertebra, subsequent encounter for fracture with routine healing: Secondary | ICD-10-CM | POA: Diagnosis not present

## 2021-03-20 DIAGNOSIS — R7981 Abnormal blood-gas level: Secondary | ICD-10-CM | POA: Diagnosis not present

## 2021-03-20 DIAGNOSIS — I4821 Permanent atrial fibrillation: Secondary | ICD-10-CM | POA: Diagnosis not present

## 2021-03-20 DIAGNOSIS — Z09 Encounter for follow-up examination after completed treatment for conditions other than malignant neoplasm: Secondary | ICD-10-CM | POA: Diagnosis not present

## 2021-03-20 DIAGNOSIS — Z79899 Other long term (current) drug therapy: Secondary | ICD-10-CM | POA: Diagnosis not present

## 2021-04-05 DIAGNOSIS — I110 Hypertensive heart disease with heart failure: Secondary | ICD-10-CM | POA: Diagnosis not present

## 2021-04-05 DIAGNOSIS — I509 Heart failure, unspecified: Secondary | ICD-10-CM | POA: Diagnosis not present

## 2021-04-12 ENCOUNTER — Telehealth: Payer: Self-pay | Admitting: Cardiology

## 2021-04-12 MED ORDER — FUROSEMIDE 40 MG PO TABS: 40.0000 mg | ORAL_TABLET | Freq: Every day | ORAL | 3 refills | Status: AC

## 2021-04-12 NOTE — Addendum Note (Signed)
Addended by: Truddie Hidden on: 04/12/2021 05:18 PM ? ? Modules accepted: Orders ? ?

## 2021-04-12 NOTE — Telephone Encounter (Signed)
Keep Lasix same and get records of vital signs and wts daily. Daughter verbalized understanding and had no additional questions. ?

## 2021-04-12 NOTE — Telephone Encounter (Signed)
Spoke with pt's daughter and pt who states that he is having swelling in his legs/feet. Pt has 2 days worth of Lasix left. BP No BP/HR or weights. Will start keeping a log. ?

## 2021-04-12 NOTE — Telephone Encounter (Signed)
Patient c/o Palpitations:  High priority if patient c/o lightheadedness, shortness of breath, or chest pain ? ?How long have you had palpitations/irregular HR/ Afib? Are you having the symptoms now?  ?Afib, daughter unsure how long patient has been going into afib. No symptoms currently. ? ?Are you currently experiencing lightheadedness, SOB or CP?  ?No  ? ?Do you have a history of afib (atrial fibrillation) or irregular heart rhythm?  ?No ? ?Have you checked your BP or HR? (document readings if available):  ?3/03: BP 160/80, HR in the 90's ? ?Are you experiencing any other symptoms?  ?Leg/feet swelling  ? ?

## 2021-05-07 DIAGNOSIS — Z7901 Long term (current) use of anticoagulants: Secondary | ICD-10-CM

## 2021-05-07 DIAGNOSIS — R601 Generalized edema: Secondary | ICD-10-CM | POA: Insufficient documentation

## 2021-05-07 DIAGNOSIS — I719 Aortic aneurysm of unspecified site, without rupture: Secondary | ICD-10-CM

## 2021-05-07 HISTORY — DX: Aortic aneurysm of unspecified site, without rupture: I71.9

## 2021-05-07 HISTORY — DX: Long term (current) use of anticoagulants: Z79.01

## 2021-05-07 HISTORY — DX: Generalized edema: R60.1

## 2021-05-08 ENCOUNTER — Ambulatory Visit: Payer: Medicare HMO | Admitting: Cardiology

## 2021-05-20 ENCOUNTER — Ambulatory Visit: Payer: Medicare HMO | Admitting: Cardiology

## 2021-07-17 ENCOUNTER — Ambulatory Visit (INDEPENDENT_AMBULATORY_CARE_PROVIDER_SITE_OTHER): Payer: Medicare HMO | Admitting: Cardiology

## 2021-07-17 ENCOUNTER — Encounter: Payer: Self-pay | Admitting: Cardiology

## 2021-07-17 ENCOUNTER — Telehealth: Payer: Self-pay | Admitting: Cardiology

## 2021-07-17 VITALS — BP 128/56 | HR 76 | Ht 70.6 in | Wt 171.4 lb

## 2021-07-17 DIAGNOSIS — I10 Essential (primary) hypertension: Secondary | ICD-10-CM

## 2021-07-17 DIAGNOSIS — I77810 Thoracic aortic ectasia: Secondary | ICD-10-CM | POA: Diagnosis not present

## 2021-07-17 DIAGNOSIS — R0902 Hypoxemia: Secondary | ICD-10-CM | POA: Insufficient documentation

## 2021-07-17 DIAGNOSIS — Z9861 Coronary angioplasty status: Secondary | ICD-10-CM

## 2021-07-17 DIAGNOSIS — I2510 Atherosclerotic heart disease of native coronary artery without angina pectoris: Secondary | ICD-10-CM | POA: Diagnosis not present

## 2021-07-17 DIAGNOSIS — Z952 Presence of prosthetic heart valve: Secondary | ICD-10-CM

## 2021-07-17 DIAGNOSIS — I712 Thoracic aortic aneurysm, without rupture, unspecified: Secondary | ICD-10-CM

## 2021-07-17 NOTE — Patient Instructions (Signed)

## 2021-07-17 NOTE — Progress Notes (Signed)
Cardiology Office Note:    Date:  07/17/2021   ID:  Eric Perry, DOB 08-Jan-1941, MRN [id number]  PCP:  Algis Greenhouse, MD  Cardiologist:  Jenean Lindau, MD   Referring MD: Algis Greenhouse, MD    ASSESSMENT:    1. Ascending aorta dilatation (HCC)   2. CAD S/P percutaneous coronary angioplasty   3. Essential (primary) hypertension   4. 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 S/P TAVR (transcatheter aortic valve replacement)    PLAN:    In order of problems listed above:  Coronary artery disease: Secondary prevention stressed with the patient.  Importance of compliance with diet medication stressed and he vocalized understanding. Essential hypertension: Blood pressure stable and diet was emphasized.  Lifestyle modification urged. Post TAVR procedure: Stable.  Echo report reviewed with the patient at length. Ascending aortic aneurysm: Stable CT scan done earlier this year was discussed and reviewed with patient and questions were answered to his satisfaction. Mixed dyslipidemia: On lipid-lowering therapy.  Followed by primary care.  Diet emphasized Atrial fibrillation:I discussed with the patient atrial fibrillation, disease process. Management and therapy including rate and rhythm control, anticoagulation benefits and potential risks were discussed extensively with the patient. Patient had multiple questions which were answered to patient's satisfaction. Patient will be seen in follow-up appointment in 6 months or earlier if the patient has any concerns    Medication Adjustments/Labs and Tests Ordered: Current medicines are reviewed at length with the patient today.  Concerns regarding medicines are outlined above.  No orders of the defined types were placed in this encounter.  No orders of the defined types were placed in this encounter.    No chief complaint on file.    History of Present Illness:    Eric Perry is a 81 y.o.  male.  Patient has past medical history of ascending aortic aneurysm, aortic stenosis post TAVR, mixed dyslipidemia and atrial fibrillation.  He denies any problems at this time and takes care of activities of daily living.  No chest pain orthopnea or PND.  He has known coronary artery disease.  At the time of my evaluation, the patient is alert awake oriented and in no distress.  Past Medical History:  Diagnosis Date   Allergic rhinitis 03/08/2021   Aortic aneurysm ([place]) 05/07/2021   Apr 15, 2021 Entered By: Juliann Pares Comment: ascending 4.3 cm stable, 3.7 cm arch segment   Aortic stenosis 08/07/2017   Ascending aorta dilatation ([place]) 05/03/2019   Atrial fibrillation ([place]) 08/07/2017   Atrial fibrillation, chronic (HCC)    a. on Eliquis   Benign hypertension 03/08/2021   Benign prostatic hyperplasia 03/08/2021   Formatting of this note might be different from the original. 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 Jan 12, 2007 Entered By: Garnett Farm Comment: private urologist biopsies 7/08Dec 02, 2008 Entered By: Stephannie Peters B Comment: RT 10/08Dec 02, 2008 Entered By: ARMOUR,ROSS B Comment: SEEDS 11/08   Encounter for  fitting and adjustment of hearing aid 03/14/2021   Essential (primary) hypertension 03/14/2021   Family history of malignant neoplasm of gastrointestinal tract 03/14/2021   Dec 10, 2011 Entered By: Rowan Blase Comment: brother dx with colon  cancer at 17 years old   Gastroesophageal reflux disease 03/08/2021   Generalized edema 05/07/2021   Hyperlipidemia    Long term (current) use of anticoagulants 05/07/2021   Low back pain 03/14/2021   Male erectile disorder (CODE) 03/08/2021   Malignant neoplasm of prostate ([place])    Osteoarthritis    "was probably in my knees" (11/17/2017)   Other specified counseling 03/14/2021   Other specified problems related to psychosocial circumstances 03/14/2021   Prediabetes 03/08/2021   Retinal hemorrhage, left eye 03/08/2021   S/P TAVR (transcatheter aortic valve replacement)    Severe aortic stenosis     Past Surgical History:  Procedure Laterality Date   APPENDECTOMY     CORONARY ANGIOPLASTY WITH STENT PLACEMENT  1990s   [place]  ~ 2004   INTRAOPERATIVE TRANSTHORACIC ECHOCARDIOGRAM N/A 11/17/2017   Procedure: INTRAOPERATIVE TRANSTHORACIC ECHOCARDIOGRAM;  Surgeon: Burnell Blanks, MD;  Location: [facility];  Service: Open Heart Surgery;  Laterality: N/A;   JOINT REPLACEMENT     REPLACEMENT TOTAL KNEE BILATERAL Bilateral    RIGHT/LEFT HEART CATH AND CORONARY ANGIOGRAPHY N/A 10/22/2017   Procedure: RIGHT/LEFT HEART CATH AND CORONARY ANGIOGRAPHY;  Surgeon: Burnell Blanks, MD;  Location: [facility];  Service: Cardiovascular;  Laterality: N/A;   SHOULDER ARTHROSCOPY W/ ROTATOR CUFF REPAIR Left 2018   TRANSCATHETER AORTIC VALVE REPLACEMENT, TRANSFEMORAL  11/17/2017   TRANSCATHETER AORTIC VALVE REPLACEMENT, TRANSFEMORAL N/A 11/17/2017   Procedure: TRANSCATHETER AORTIC VALVE REPLACEMENT, TRANSFEMORAL. 48m Edwards SAPIEN 3 THV.;  Surgeon: MBurnell Blanks MD;  Location: [facility]  Service: Open Heart Surgery;  Laterality: N/A;    Current Medications: Current Meds  Medication Sig   amLODipine (NORVASC) 5 MG tablet Take 5 mg by mouth daily.   apixaban (ELIQUIS) 5 MG TABS tablet Take 5 mg by mouth 2 (two) times daily.   atorvastatin (LIPITOR) 40 MG  tablet Take 40 mg by mouth daily.   finasteride (PROSCAR) 5 MG tablet Take 1 tablet by mouth daily.   furosemide (LASIX) 40 MG tablet Take 1 tablet (40 mg total) by mouth daily.   lansoprazole (PREVACID) 15 MG capsule Take 15 mg by mouth daily before breakfast.    morphine (MSIR) 15 MG tablet Take 15 mg by mouth 3 (three) times daily as needed for severe pain.    Omega-3 Fatty Acids (FISH OIL) 1000 MG CAPS Take 1,000 mg by mouth 2 (two) times daily.    omeprazole (PRILOSEC) 20 MG capsule Take 20 mg by mouth daily.   Polyethyl Glycol-Propyl Glycol 0.4-0.3 % SOLN Place 1 drop into both eyes 3 (three) times daily as needed (for dry eyes).    tamsulosin (FLOMAX) 0.4 MG CAPS capsule Take 0.4 mg by mouth daily.    Vitamin D, Ergocalciferol, (DRISDOL) 1.25 MG (50000 UNIT) CAPS capsule Take 50,000 Units by mouth once a week.     Allergies:   Rocephin [ceftriaxone sodium in dextrose], Versed [midazolam], and Gemfibrozil   Social History   Socioeconomic History   Marital status: Married    Spouse name: Not on file   Number of children: 4   Years of education: Not on file   Highest education level: Not on file  Occupational History   Occupation: Retired pTherapist, nutritional plant superviisor  Tobacco Use   Smoking status: Former    Packs/day: 0.50    Years: 10.00    Pack years: 5.00    Types: Cigarettes    Quit date: 02/11/1960    Years since quitting: 61.4   Smokeless tobacco: Never  Vaping Use   Vaping Use: Never used  Substance and Sexual Activity   Alcohol use: Yes    Comment: 11/17/2017 "1 beer/month"   Drug use: Never   Sexual activity: Not Currently  Other Topics Concern   Not on file  Social History Narrative   Not on file   Social Determinants of Health   Financial Resource Strain: Not on file  Food Insecurity: Not on file  Transportation Needs: Not on file  Physical Activity: Not on file  Stress: Not on file  Social Connections: Not on file     Family History: The  patient's family history includes Colon cancer in his mother. There is no history of CAD or Sudden Cardiac Death.  ROS:   Please see the history of present illness.    All other systems reviewed and are negative.  EKGs/Labs/Other Studies Reviewed:    The following studies were reviewed today: I discussed my findings with the patient at length   Recent Labs: No results found for requested labs within last 8760 hours.  Recent Lipid Panel    Component Value Date/Time   CHOL 123 06/14/2019 1342   TRIG 81 06/14/2019 1342   HDL 52 06/14/2019 1342   CHOLHDL 2.4 06/14/2019 1342   LDLCALC 55 06/14/2019 1342    Physical Exam:    VS:  BP (!) 128/56   Pulse 76   Ht 5' 10.6" (1.793 m)   Wt 171 lb 6.4 oz (77.7 kg)   SpO2 96%   BMI 24.18 kg/m     Wt Readings from Last 3 Encounters:  07/17/21 171 lb 6.4 oz (77.7 kg)  08/17/20 191 lb (86.6 kg)  12/06/19 190 lb (86.2 kg)     GEN: Patient is in no acute distress HEENT: Normal NECK: No JVD; No carotid bruits LYMPHATICS: No lymphadenopathy CARDIAC: Hear sounds regular, 2/6 systolic murmur at the apex. RESPIRATORY:  Clear to auscultation without rales, wheezing or rhonchi  ABDOMEN: Soft, non-tender, non-distended MUSCULOSKELETAL:  No edema; No deformity  SKIN: Warm and dry NEUROLOGIC:  Alert and oriented x 3 PSYCHIATRIC:  Normal affect   Signed, Jenean Lindau, MD  07/17/2021 3:02 PM    [facility]

## 2021-07-17 NOTE — Telephone Encounter (Signed)
If you need current information or labs on patient, patient now goes to the [place] [facility]. Please request from them

## 2021-09-17 ENCOUNTER — Telehealth: Payer: Self-pay

## 2021-09-17 NOTE — Telephone Encounter (Signed)
Patient's wife called office today regarding upcoming appointment on 8/15. Would like to know if this appointment could be done as a phone visit. States that Cathy is not mobile and needs at least four people to help him get around.  Informed his wife that I could ask provider, but since he has not been seen by one of our providers he might need to be seen in person. Verbalized understanding.  Will forward message to provider. Leatrice Jewels, RMA

## 2021-09-24 ENCOUNTER — Other Ambulatory Visit: Payer: Self-pay

## 2021-09-24 ENCOUNTER — Ambulatory Visit: Payer: Medicare HMO | Admitting: Internal Medicine

## 2021-09-24 NOTE — Progress Notes (Unsigned)
[facility]  Reason for Consult:***  Referring Provider: ***   HPI:    Eric Perry is a 81 y.o. male with PMHx as below who presents to the clinic for ***.   ***  Patient's Medications  New Prescriptions   No medications on file  Previous Medications   AMLODIPINE (NORVASC) 5 MG TABLET    Take 5 mg by mouth daily.   APIXABAN (ELIQUIS) 5 MG TABS TABLET    Take 5 mg by mouth 2 (two) times daily.   ATORVASTATIN (LIPITOR) 40 MG TABLET    Take 40 mg by mouth daily.   FINASTERIDE (PROSCAR) 5 MG TABLET    Take 1 tablet by mouth daily.   FUROSEMIDE (LASIX) 40 MG TABLET    Take 1 tablet (40 mg total) by mouth daily.   LANSOPRAZOLE (PREVACID) 15 MG CAPSULE    Take 15 mg by mouth daily before breakfast.    MORPHINE (MSIR) 15 MG TABLET    Take 15 mg by mouth 3 (three) times daily as needed for severe pain.    OMEGA-3 FATTY ACIDS (FISH OIL) 1000 MG CAPS    Take 1,000 mg by mouth 2 (two) times daily.    OMEPRAZOLE (PRILOSEC) 20 MG CAPSULE    Take 20 mg by mouth daily.   POLYETHYL GLYCOL-PROPYL GLYCOL 0.4-0.3 % SOLN    Place 1 drop into both eyes 3 (three) times daily as needed (for dry eyes).    TAMSULOSIN (FLOMAX) 0.4 MG CAPS CAPSULE    Take 0.4 mg by mouth daily.    VITAMIN D, ERGOCALCIFEROL, (DRISDOL) 1.25 MG (50000 UNIT) CAPS CAPSULE    Take 50,000 Units by mouth once a week.  Modified Medications   No medications on file  Discontinued Medications   No medications on file      Past Medical History:  Diagnosis Date   Allergic rhinitis 03/08/2021   Aortic aneurysm ([place]) 05/07/2021   Apr 15, 2021 Entered By: Juliann Pares Comment: ascending 4.3 cm stable, 3.7 cm arch segment   Aortic stenosis 08/07/2017   Ascending aorta dilatation ([place]) 05/03/2019   Atrial fibrillation ([place]) 08/07/2017   Atrial fibrillation, chronic (HCC)    a. on Eliquis   Benign hypertension 03/08/2021   Benign prostatic hyperplasia 03/08/2021   Formatting of this note  might be different from the original. Jul 22, 2007 Entered By: Garnett Farm Comment: private urologistMar 21, 2011 Entered By: Darron Doom Comment: Dr Karsten Ro [phone] Jul 22, 2007 Entered By: Garnett Farm Comment: private urologistMar 21, 2011 Entered By: Darron Doom Comment: Dr Karsten Ro [phone]   Bilateral sensorineural hearing loss 03/08/2021   CAD (coronary artery disease)    a. 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 Jan 12, 2007 Entered By: Garnett Farm Comment: private urologist biopsies 7/08Dec 02, 2008 Entered By: Stephannie Peters B Comment: RT 10/08Dec 02, 2008 Entered By: ARMOUR,ROSS B Comment: SEEDS 11/08   Encounter for fitting and adjustment of hearing aid 03/14/2021   Essential (primary) hypertension 03/14/2021   Family history of malignant neoplasm of gastrointestinal tract 03/14/2021   Dec 10, 2011 Entered By: Rowan Blase Comment: brother dx with colon cancer at 63 years old   Gastroesophageal reflux disease 03/08/2021   Generalized edema 05/07/2021   Hyperlipidemia    Long term (current) use  of anticoagulants 05/07/2021   Low back pain 03/14/2021   Male erectile disorder (CODE) 03/08/2021   Malignant neoplasm of prostate ([place])    Osteoarthritis    "was probably in my knees" (11/17/2017)   Other specified counseling 03/14/2021   Other specified problems related to psychosocial circumstances 03/14/2021   Prediabetes 03/08/2021   Retinal hemorrhage, left eye 03/08/2021   S/P TAVR (transcatheter aortic valve replacement)    Severe aortic stenosis     Social History   Tobacco Use   Smoking status: Former    Packs/day: 0.50    Years: 10.00    Total pack years: 5.00    Types: Cigarettes    Quit date: 02/11/1960    Years since quitting: 61.6   Smokeless tobacco: Never  Vaping Use   Vaping  Use: Never used  Substance Use Topics   Alcohol use: Yes    Comment: 11/17/2017 "1 beer/month"   Drug use: Never    Family History  Problem Relation Age of Onset   Colon cancer Mother    CAD Neg Hx    Sudden Cardiac Death Neg Hx     Allergies  Allergen Reactions   Rocephin [Ceftriaxone Sodium In Dextrose] Anaphylaxis and Shortness Of Breath   Versed [Midazolam] Shortness Of Breath   Gemfibrozil     ROS    OBJECTIVE:    There were no vitals filed for this visit.   There is no height or weight on file to calculate BMI.  Physical Exam   Labs and Microbiology:     Latest Ref Rng & Units 06/14/2019    1:42 PM 11/18/2017    3:56 AM 11/17/2017    1:02 PM  CBC  WBC 3.4 - 10.8 x10E3/uL 5.1  8.6    Hemoglobin 13.0 - 17.7 g/dL 11.9  12.5  12.6   Hematocrit 37.5 - 51.0 % 40.1  40.0  37.0   Platelets 150 - 450 x10E3/uL 167  124        Latest Ref Rng & Units 12/06/2019    2:22 PM 06/14/2019    1:42 PM 02/17/2018    1:50 PM  CMP  Glucose 65 - 99 mg/dL 95  116  93   BUN 8 - 27 mg/dL '11  11  11   '$ Creatinine 0.76 - 1.27 mg/dL 0.62  0.63  0.62   Sodium 134 - 144 mmol/L 140  138  142   Potassium 3.5 - 5.2 mmol/L 4.6  4.0  4.5   Chloride 96 - 106 mmol/L 101  100  103   CO2 20 - 29 mmol/L '27  26  26   '$ Calcium 8.6 - 10.2 mg/dL 9.4  9.8  9.2   Total Protein 6.0 - 8.5 g/dL 6.9  6.8    Total Bilirubin 0.0 - 1.2 mg/dL 1.1  0.9    Alkaline Phos 44 - 121 IU/L 90  91    AST 0 - 40 IU/L 13  13    ALT 0 - 44 IU/L 7  8       No results found for this or any previous visit (from the past 240 hour(s)).  Imaging: ***   ASSESSMENT & PLAN:    No problem-specific Assessment & Plan notes found for this encounter.   No orders of the defined types were placed in this encounter.     ***  Raynelle [facility] 09/24/2021, 12:05 PM

## 2021-09-25 ENCOUNTER — Telehealth: Payer: Self-pay | Admitting: Cardiology

## 2021-09-25 NOTE — Telephone Encounter (Signed)
Note reviewed with Butch Penny as per Dr. Julien Nordmann note.  Butch Penny verbalized understanding and had no additional questions.

## 2021-09-25 NOTE — Telephone Encounter (Signed)
Pt c/o medication issue:  1. Name of Medication:   apixaban (ELIQUIS) 5 MG TABS tablet    2. How are you currently taking this medication (dosage and times per day)? Take 5 mg by mouth 2 (two) times daily.  3. Are you having a reaction (difficulty breathing--STAT)? No  4. What is your medication issue? Daughter states that pt is currently in the hospital for pneumonia. She says that the doctors at the hospital are wanting to take pt off medication for an extended amount of time. She would like a second opinion from Dr. Geraldo Pitter. Please advise

## 2021-09-27 DIAGNOSIS — R0600 Dyspnea, unspecified: Secondary | ICD-10-CM

## 2021-09-27 DIAGNOSIS — J9691 Respiratory failure, unspecified with hypoxia: Secondary | ICD-10-CM

## 2021-09-27 DIAGNOSIS — I482 Chronic atrial fibrillation, unspecified: Secondary | ICD-10-CM

## 2021-09-27 DIAGNOSIS — D649 Anemia, unspecified: Secondary | ICD-10-CM

## 2021-09-27 DIAGNOSIS — I351 Nonrheumatic aortic (valve) insufficiency: Secondary | ICD-10-CM

## 2021-09-27 DIAGNOSIS — R001 Bradycardia, unspecified: Secondary | ICD-10-CM

## 2021-09-27 DIAGNOSIS — T8454XA Infection and inflammatory reaction due to internal left knee prosthesis, initial encounter: Secondary | ICD-10-CM

## 2021-09-27 DIAGNOSIS — I509 Heart failure, unspecified: Secondary | ICD-10-CM

## 2021-09-28 DIAGNOSIS — I482 Chronic atrial fibrillation, unspecified: Secondary | ICD-10-CM

## 2021-09-28 DIAGNOSIS — D649 Anemia, unspecified: Secondary | ICD-10-CM

## 2021-09-28 DIAGNOSIS — A419 Sepsis, unspecified organism: Secondary | ICD-10-CM

## 2021-09-29 ENCOUNTER — Inpatient Hospital Stay
Admission: AD | Admit: 2021-09-29 | Payer: Medicare HMO | Source: Other Acute Inpatient Hospital | Admitting: Internal Medicine

## 2021-09-29 ENCOUNTER — Encounter (HOSPITAL_COMMUNITY): Payer: Self-pay

## 2021-09-29 DIAGNOSIS — A419 Sepsis, unspecified organism: Secondary | ICD-10-CM | POA: Diagnosis not present

## 2021-09-29 DIAGNOSIS — I482 Chronic atrial fibrillation, unspecified: Secondary | ICD-10-CM | POA: Diagnosis not present

## 2021-09-29 DIAGNOSIS — D649 Anemia, unspecified: Secondary | ICD-10-CM | POA: Diagnosis not present

## 2021-10-11 DEATH — deceased
